# Patient Record
Sex: Female | Born: 1965 | Race: White | Hispanic: No | Marital: Married | Smoking: Never smoker
Health system: Southern US, Community
[De-identification: ages and names within clinical notes are randomized; demographics above are authoritative.]

## PROBLEM LIST (undated history)

## (undated) DIAGNOSIS — K219 Gastro-esophageal reflux disease without esophagitis: Secondary | ICD-10-CM

## (undated) DIAGNOSIS — Z9889 Other specified postprocedural states: Secondary | ICD-10-CM

## (undated) DIAGNOSIS — Z87442 Personal history of urinary calculi: Secondary | ICD-10-CM

## (undated) DIAGNOSIS — R112 Nausea with vomiting, unspecified: Secondary | ICD-10-CM

## (undated) DIAGNOSIS — N200 Calculus of kidney: Secondary | ICD-10-CM

## (undated) HISTORY — PX: COLONOSCOPY: SHX174

## (undated) HISTORY — DX: Calculus of kidney: N20.0

---

## 2005-06-26 ENCOUNTER — Ambulatory Visit: Payer: Self-pay | Admitting: Family Medicine

## 2006-08-05 ENCOUNTER — Ambulatory Visit: Payer: Self-pay | Admitting: Family Medicine

## 2007-08-11 ENCOUNTER — Ambulatory Visit: Payer: Self-pay | Admitting: Family Medicine

## 2008-01-27 ENCOUNTER — Ambulatory Visit: Payer: Self-pay | Admitting: Gastroenterology

## 2008-11-07 ENCOUNTER — Ambulatory Visit: Payer: Self-pay | Admitting: Family Medicine

## 2009-12-13 ENCOUNTER — Ambulatory Visit: Payer: Self-pay | Admitting: Family Medicine

## 2010-12-20 ENCOUNTER — Ambulatory Visit: Payer: Self-pay | Admitting: Family Medicine

## 2012-01-02 ENCOUNTER — Ambulatory Visit: Payer: Self-pay | Admitting: Family Medicine

## 2013-02-23 ENCOUNTER — Ambulatory Visit: Payer: Self-pay | Admitting: Family Medicine

## 2013-03-16 ENCOUNTER — Ambulatory Visit: Payer: Self-pay | Admitting: Family Medicine

## 2014-10-02 ENCOUNTER — Other Ambulatory Visit: Payer: Self-pay | Admitting: Otolaryngology

## 2014-10-02 DIAGNOSIS — H9042 Sensorineural hearing loss, unilateral, left ear, with unrestricted hearing on the contralateral side: Secondary | ICD-10-CM

## 2014-10-06 ENCOUNTER — Ambulatory Visit
Admission: RE | Admit: 2014-10-06 | Discharge: 2014-10-06 | Disposition: A | Discharge status: Home / Self Care | Payer: 59 | Source: Ambulatory Visit | Attending: Otolaryngology | Admitting: Otolaryngology

## 2014-10-06 DIAGNOSIS — H9042 Sensorineural hearing loss, unilateral, left ear, with unrestricted hearing on the contralateral side: Secondary | ICD-10-CM | POA: Insufficient documentation

## 2014-10-06 DIAGNOSIS — M5022 Other cervical disc displacement, mid-cervical region: Secondary | ICD-10-CM | POA: Insufficient documentation

## 2014-10-06 DIAGNOSIS — M47812 Spondylosis without myelopathy or radiculopathy, cervical region: Secondary | ICD-10-CM | POA: Diagnosis not present

## 2014-10-06 DIAGNOSIS — M5021 Other cervical disc displacement,  high cervical region: Secondary | ICD-10-CM | POA: Diagnosis not present

## 2014-10-06 MED ORDER — GADOBENATE DIMEGLUMINE 529 MG/ML IV SOLN
15.0000 mL | Freq: Once | INTRAVENOUS | Status: AC | PRN
  Administered 2014-10-06: 15 mL via INTRAVENOUS

## 2015-02-08 ENCOUNTER — Other Ambulatory Visit: Payer: Self-pay | Admitting: Family Medicine

## 2015-02-08 DIAGNOSIS — Z1231 Encounter for screening mammogram for malignant neoplasm of breast: Secondary | ICD-10-CM

## 2015-03-05 ENCOUNTER — Ambulatory Visit
Admission: RE | Admit: 2015-03-05 | Discharge: 2015-03-05 | Disposition: A | Discharge status: Home / Self Care | Payer: 59 | Source: Ambulatory Visit | Attending: Family Medicine | Admitting: Family Medicine

## 2015-03-05 DIAGNOSIS — Z1231 Encounter for screening mammogram for malignant neoplasm of breast: Secondary | ICD-10-CM | POA: Insufficient documentation

## 2016-11-18 ENCOUNTER — Other Ambulatory Visit: Payer: Self-pay | Admitting: Family Medicine

## 2016-11-18 DIAGNOSIS — Z1231 Encounter for screening mammogram for malignant neoplasm of breast: Secondary | ICD-10-CM

## 2016-12-12 ENCOUNTER — Ambulatory Visit
Admission: RE | Admit: 2016-12-12 | Discharge: 2016-12-12 | Disposition: A | Discharge status: Home / Self Care | Payer: 59 | Source: Ambulatory Visit | Attending: Family Medicine | Admitting: Family Medicine

## 2016-12-12 DIAGNOSIS — Z1231 Encounter for screening mammogram for malignant neoplasm of breast: Secondary | ICD-10-CM | POA: Insufficient documentation

## 2017-11-09 ENCOUNTER — Other Ambulatory Visit: Payer: Self-pay | Admitting: Family Medicine

## 2017-11-09 DIAGNOSIS — Z1231 Encounter for screening mammogram for malignant neoplasm of breast: Secondary | ICD-10-CM

## 2017-12-18 ENCOUNTER — Ambulatory Visit
Admission: RE | Admit: 2017-12-18 | Discharge: 2017-12-18 | Disposition: A | Discharge status: Home / Self Care | Payer: 59 | Source: Ambulatory Visit | Attending: Family Medicine | Admitting: Family Medicine

## 2017-12-18 DIAGNOSIS — Z1231 Encounter for screening mammogram for malignant neoplasm of breast: Secondary | ICD-10-CM | POA: Diagnosis not present

## 2018-10-26 ENCOUNTER — Other Ambulatory Visit: Payer: Self-pay | Admitting: Family Medicine

## 2018-10-26 DIAGNOSIS — Z1231 Encounter for screening mammogram for malignant neoplasm of breast: Secondary | ICD-10-CM

## 2018-12-23 ENCOUNTER — Encounter (INDEPENDENT_AMBULATORY_CARE_PROVIDER_SITE_OTHER): Payer: Self-pay

## 2018-12-23 ENCOUNTER — Other Ambulatory Visit: Payer: Self-pay

## 2018-12-23 ENCOUNTER — Ambulatory Visit
Admission: RE | Admit: 2018-12-23 | Discharge: 2018-12-23 | Disposition: A | Discharge status: Home / Self Care | Payer: Managed Care, Other (non HMO) | Source: Ambulatory Visit | Attending: Family Medicine | Admitting: Family Medicine

## 2018-12-23 DIAGNOSIS — Z1231 Encounter for screening mammogram for malignant neoplasm of breast: Secondary | ICD-10-CM | POA: Insufficient documentation

## 2019-11-16 ENCOUNTER — Other Ambulatory Visit: Payer: Self-pay | Admitting: Family Medicine

## 2019-11-16 DIAGNOSIS — Z1231 Encounter for screening mammogram for malignant neoplasm of breast: Secondary | ICD-10-CM

## 2019-12-29 ENCOUNTER — Other Ambulatory Visit: Payer: Self-pay

## 2019-12-29 ENCOUNTER — Ambulatory Visit
Admission: RE | Admit: 2019-12-29 | Discharge: 2019-12-29 | Disposition: A | Discharge status: Home / Self Care | Payer: Managed Care, Other (non HMO) | Source: Ambulatory Visit | Attending: Family Medicine | Admitting: Family Medicine

## 2019-12-29 DIAGNOSIS — Z1231 Encounter for screening mammogram for malignant neoplasm of breast: Secondary | ICD-10-CM

## 2020-04-20 ENCOUNTER — Observation Stay: Payer: Managed Care, Other (non HMO) | Admitting: Registered Nurse

## 2020-04-20 ENCOUNTER — Observation Stay: Payer: Managed Care, Other (non HMO)

## 2020-04-20 ENCOUNTER — Other Ambulatory Visit: Payer: Self-pay

## 2020-04-20 ENCOUNTER — Emergency Department: Payer: Managed Care, Other (non HMO)

## 2020-04-20 ENCOUNTER — Ambulatory Visit
Admission: EM | Admit: 2020-04-20 | Discharge: 2020-04-20 | Disposition: A | Discharge status: Home / Self Care | Payer: Managed Care, Other (non HMO) | Source: Home / Self Care

## 2020-04-20 ENCOUNTER — Inpatient Hospital Stay
Admission: EM | Admit: 2020-04-20 | Discharge: 2020-04-24 | DRG: 854 | Disposition: A | Discharge status: Home / Self Care | Payer: Managed Care, Other (non HMO) | Attending: Internal Medicine | Admitting: Internal Medicine

## 2020-04-20 ENCOUNTER — Encounter
Admission: EM | Disposition: A | Discharge status: Home / Self Care | Payer: Self-pay | Source: Home / Self Care | Attending: Internal Medicine

## 2020-04-20 ENCOUNTER — Ambulatory Visit (INDEPENDENT_AMBULATORY_CARE_PROVIDER_SITE_OTHER): Payer: Managed Care, Other (non HMO)

## 2020-04-20 DIAGNOSIS — N390 Urinary tract infection, site not specified: Secondary | ICD-10-CM

## 2020-04-20 DIAGNOSIS — D72829 Elevated white blood cell count, unspecified: Secondary | ICD-10-CM | POA: Diagnosis not present

## 2020-04-20 DIAGNOSIS — N200 Calculus of kidney: Secondary | ICD-10-CM

## 2020-04-20 DIAGNOSIS — Z20822 Contact with and (suspected) exposure to covid-19: Secondary | ICD-10-CM | POA: Diagnosis present

## 2020-04-20 DIAGNOSIS — Z8679 Personal history of other diseases of the circulatory system: Secondary | ICD-10-CM

## 2020-04-20 DIAGNOSIS — A419 Sepsis, unspecified organism: Secondary | ICD-10-CM | POA: Diagnosis present

## 2020-04-20 DIAGNOSIS — Z87442 Personal history of urinary calculi: Secondary | ICD-10-CM

## 2020-04-20 DIAGNOSIS — A4150 Gram-negative sepsis, unspecified: Secondary | ICD-10-CM | POA: Diagnosis not present

## 2020-04-20 DIAGNOSIS — N136 Pyonephrosis: Secondary | ICD-10-CM | POA: Diagnosis present

## 2020-04-20 DIAGNOSIS — N132 Hydronephrosis with renal and ureteral calculous obstruction: Secondary | ICD-10-CM | POA: Diagnosis not present

## 2020-04-20 DIAGNOSIS — N12 Tubulo-interstitial nephritis, not specified as acute or chronic: Secondary | ICD-10-CM

## 2020-04-20 DIAGNOSIS — Z79899 Other long term (current) drug therapy: Secondary | ICD-10-CM

## 2020-04-20 DIAGNOSIS — I10 Essential (primary) hypertension: Secondary | ICD-10-CM | POA: Diagnosis present

## 2020-04-20 DIAGNOSIS — E876 Hypokalemia: Secondary | ICD-10-CM | POA: Diagnosis present

## 2020-04-20 DIAGNOSIS — R109 Unspecified abdominal pain: Secondary | ICD-10-CM | POA: Diagnosis not present

## 2020-04-20 DIAGNOSIS — R1032 Left lower quadrant pain: Secondary | ICD-10-CM | POA: Diagnosis not present

## 2020-04-20 DIAGNOSIS — N202 Calculus of kidney with calculus of ureter: Secondary | ICD-10-CM

## 2020-04-20 HISTORY — PX: CYSTOSCOPY WITH STENT PLACEMENT: SHX5790

## 2020-04-20 LAB — CBC WITH DIFFERENTIAL/PLATELET
Abs Immature Granulocytes: 0.42 10*3/uL — ABNORMAL HIGH (ref 0.00–0.07)
Basophils Absolute: 0.2 10*3/uL — ABNORMAL HIGH (ref 0.0–0.1)
Basophils Relative: 0 %
Eosinophils Absolute: 0.1 10*3/uL (ref 0.0–0.5)
Eosinophils Relative: 0 %
HCT: 40.6 % (ref 36.0–46.0)
Hemoglobin: 13.5 g/dL (ref 12.0–15.0)
Immature Granulocytes: 1 %
Lymphocytes Relative: 4 %
Lymphs Abs: 1.4 10*3/uL (ref 0.7–4.0)
MCH: 31.1 pg (ref 26.0–34.0)
MCHC: 33.3 g/dL (ref 30.0–36.0)
MCV: 93.5 fL (ref 80.0–100.0)
Monocytes Absolute: 0.3 10*3/uL (ref 0.1–1.0)
Monocytes Relative: 1 %
Neutro Abs: 33.5 10*3/uL — ABNORMAL HIGH (ref 1.7–7.7)
Neutrophils Relative %: 94 %
Platelets: 565 10*3/uL — ABNORMAL HIGH (ref 150–400)
RBC: 4.34 MIL/uL (ref 3.87–5.11)
RDW: 12.4 % (ref 11.5–15.5)
Smear Review: NORMAL
WBC Morphology: INCREASED
WBC: 35.9 10*3/uL — ABNORMAL HIGH (ref 4.0–10.5)
nRBC: 0 % (ref 0.0–0.2)

## 2020-04-20 LAB — URINALYSIS, COMPLETE (UACMP) WITH MICROSCOPIC
Bilirubin Urine: NEGATIVE
Glucose, UA: NEGATIVE mg/dL
Ketones, ur: NEGATIVE mg/dL
Nitrite: POSITIVE — AB
Protein, ur: NEGATIVE mg/dL
Specific Gravity, Urine: 1.02 (ref 1.005–1.030)
pH: 5.5 (ref 5.0–8.0)

## 2020-04-20 LAB — COMPREHENSIVE METABOLIC PANEL
ALT: 31 U/L (ref 0–44)
AST: 28 U/L (ref 15–41)
Albumin: 4 g/dL (ref 3.5–5.0)
Alkaline Phosphatase: 87 U/L (ref 38–126)
Anion gap: 12 (ref 5–15)
BUN: 19 mg/dL (ref 6–20)
CO2: 23 mmol/L (ref 22–32)
Calcium: 9.6 mg/dL (ref 8.9–10.3)
Chloride: 102 mmol/L (ref 98–111)
Creatinine, Ser: 0.87 mg/dL (ref 0.44–1.00)
GFR, Estimated: >60 mL/min (ref >60)
Glucose, Bld: 112 mg/dL — ABNORMAL HIGH (ref 70–99)
Potassium: 3.2 mmol/L — ABNORMAL LOW (ref 3.5–5.1)
Sodium: 137 mmol/L (ref 135–145)
Total Bilirubin: 0.6 mg/dL (ref 0.3–1.2)
Total Protein: 8 g/dL (ref 6.5–8.1)

## 2020-04-20 LAB — RESP PANEL BY RT-PCR (FLU A&B, COVID) ARPGX2
Influenza A by PCR: NEGATIVE
Influenza B by PCR: NEGATIVE
SARS Coronavirus 2 by RT PCR: NEGATIVE

## 2020-04-20 LAB — HIV ANTIBODY (ROUTINE TESTING W REFLEX): HIV Screen 4th Generation wRfx: NONREACTIVE

## 2020-04-20 SURGERY — CYSTOSCOPY, WITH STENT INSERTION
Anesthesia: General | Laterality: Left

## 2020-04-20 MED ORDER — IOPAMIDOL (ISOVUE-300) INJECTION 61%
INTRAVENOUS | Status: DC | PRN
  Administered 2020-04-20: 20 mL via URETHRAL

## 2020-04-20 MED ORDER — SODIUM CHLORIDE 0.9 % IR SOLN
Status: DC | PRN
  Administered 2020-04-20: 3000 mL via INTRAVESICAL

## 2020-04-20 MED ORDER — ONDANSETRON HCL 4 MG PO TABS
4.0000 mg | ORAL_TABLET | Freq: Four times a day (QID) | ORAL | Status: DC | PRN
  Administered 2020-04-22: 4 mg via ORAL
  Filled 2020-04-20: qty 1

## 2020-04-20 MED ORDER — ACETAMINOPHEN 500 MG PO TABS
1000.0000 mg | ORAL_TABLET | Freq: Four times a day (QID) | ORAL | Status: AC | PRN
  Administered 2020-04-21 – 2020-04-23 (×9): 1000 mg via ORAL
  Filled 2020-04-20 (×10): qty 2

## 2020-04-20 MED ORDER — FENTANYL CITRATE (PF) 100 MCG/2ML IJ SOLN
INTRAMUSCULAR | Status: AC
  Filled 2020-04-20: qty 2

## 2020-04-20 MED ORDER — STERILE WATER FOR IRRIGATION IR SOLN
Status: DC | PRN
  Administered 2020-04-20: 1000 mL

## 2020-04-20 MED ORDER — SODIUM CHLORIDE 0.9 % IV BOLUS
1000.0000 mL | Freq: Once | INTRAVENOUS | Status: AC
  Administered 2020-04-20: 1000 mL via INTRAVENOUS

## 2020-04-20 MED ORDER — MIDAZOLAM HCL 2 MG/2ML IJ SOLN
INTRAMUSCULAR | Status: DC | PRN
  Administered 2020-04-20: 2 mg via INTRAVENOUS

## 2020-04-20 MED ORDER — SCOPOLAMINE 1 MG/3DAYS TD PT72
1.0000 | MEDICATED_PATCH | Freq: Once | TRANSDERMAL | Status: DC
  Administered 2020-04-20: 1.5 mg via TRANSDERMAL

## 2020-04-20 MED ORDER — FENTANYL CITRATE (PF) 100 MCG/2ML IJ SOLN
INTRAMUSCULAR | Status: DC | PRN
  Administered 2020-04-20 (×2): 50 ug via INTRAVENOUS

## 2020-04-20 MED ORDER — MIDAZOLAM HCL 2 MG/2ML IJ SOLN
INTRAMUSCULAR | Status: AC
  Filled 2020-04-20: qty 2

## 2020-04-20 MED ORDER — ONDANSETRON HCL 4 MG/2ML IJ SOLN
4.0000 mg | Freq: Four times a day (QID) | INTRAMUSCULAR | Status: DC | PRN
  Administered 2020-04-21: 4 mg via INTRAVENOUS
  Filled 2020-04-20: qty 2

## 2020-04-20 MED ORDER — MORPHINE SULFATE (PF) 4 MG/ML IV SOLN
4.0000 mg | Freq: Once | INTRAVENOUS | Status: AC
  Administered 2020-04-20: 4 mg via INTRAVENOUS
  Filled 2020-04-20: qty 1

## 2020-04-20 MED ORDER — LACTATED RINGERS IV SOLN: INTRAVENOUS | Status: AC

## 2020-04-20 MED ORDER — PROPOFOL 10 MG/ML IV BOLUS
INTRAVENOUS | Status: DC | PRN
  Administered 2020-04-20: 160 mg via INTRAVENOUS

## 2020-04-20 MED ORDER — FENTANYL CITRATE (PF) 100 MCG/2ML IJ SOLN
25.0000 ug | INTRAMUSCULAR | Status: DC | PRN
Start: 2020-04-20 — End: 2020-04-20

## 2020-04-20 MED ORDER — PROPOFOL 10 MG/ML IV BOLUS
INTRAVENOUS | Status: AC
  Filled 2020-04-20: qty 20

## 2020-04-20 MED ORDER — ONDANSETRON HCL 4 MG/2ML IJ SOLN
INTRAMUSCULAR | Status: AC
  Filled 2020-04-20: qty 2

## 2020-04-20 MED ORDER — SODIUM CHLORIDE 0.9 % IV SOLN
1.0000 g | Freq: Once | INTRAVENOUS | Status: AC
  Administered 2020-04-20: 1 g via INTRAVENOUS
  Filled 2020-04-20: qty 10

## 2020-04-20 MED ORDER — KETOROLAC TROMETHAMINE 30 MG/ML IJ SOLN
INTRAMUSCULAR | Status: DC | PRN
  Administered 2020-04-20: 30 mg via INTRAVENOUS

## 2020-04-20 MED ORDER — MORPHINE SULFATE (PF) 4 MG/ML IV SOLN
4.0000 mg | INTRAVENOUS | Status: AC | PRN
  Administered 2020-04-20 – 2020-04-22 (×4): 4 mg via INTRAVENOUS
  Filled 2020-04-20 (×4): qty 1

## 2020-04-20 MED ORDER — SODIUM CHLORIDE 0.9 % IV SOLN
1.0000 g | INTRAVENOUS | Status: DC
  Administered 2020-04-21: 1 g via INTRAVENOUS
  Filled 2020-04-20: qty 10
  Filled 2020-04-20: qty 1

## 2020-04-20 MED ORDER — ONDANSETRON HCL 4 MG/2ML IJ SOLN
4.0000 mg | Freq: Once | INTRAMUSCULAR | Status: AC | PRN
  Administered 2020-04-20: 4 mg via INTRAVENOUS

## 2020-04-20 MED ORDER — ACETAMINOPHEN 650 MG RE SUPP: 650.0000 mg | Freq: Four times a day (QID) | RECTAL | Status: AC | PRN

## 2020-04-20 MED ORDER — KETOROLAC TROMETHAMINE 15 MG/ML IJ SOLN
15.0000 mg | Freq: Four times a day (QID) | INTRAMUSCULAR | Status: AC | PRN
  Administered 2020-04-21: 15 mg via INTRAVENOUS
  Filled 2020-04-20: qty 1

## 2020-04-20 MED ORDER — POTASSIUM CHLORIDE 10 MEQ/100ML IV SOLN
10.0000 meq | INTRAVENOUS | Status: AC
  Administered 2020-04-20: 10 meq via INTRAVENOUS
  Filled 2020-04-20 (×2): qty 100

## 2020-04-20 MED ORDER — ONDANSETRON HCL 4 MG/2ML IJ SOLN
4.0000 mg | Freq: Once | INTRAMUSCULAR | Status: AC
  Administered 2020-04-20: 4 mg via INTRAVENOUS
  Filled 2020-04-20: qty 2

## 2020-04-20 SURGICAL SUPPLY — 17 items
BAG DRAIN CYSTO-URO LG1000N (MISCELLANEOUS) ×3 IMPLANT
BRUSH SCRUB EZ  4% CHG (MISCELLANEOUS) ×2
BRUSH SCRUB EZ 4% CHG (MISCELLANEOUS) ×1 IMPLANT
CATH URETL 5X70 OPEN END (CATHETERS) ×3 IMPLANT
CONRAY 43 FOR UROLOGY 50M (MISCELLANEOUS) ×3 IMPLANT
DRAPE UTILITY 15X26 TOWEL STRL (DRAPES) ×3 IMPLANT
GOWN STRL REUS W/ TWL LRG LVL3 (GOWN DISPOSABLE) ×1 IMPLANT
GOWN STRL REUS W/TWL LRG LVL3 (GOWN DISPOSABLE) ×2
GUIDEWIRE STR DUAL SENSOR (WIRE) ×3 IMPLANT
IV NS IRRIG 3000ML ARTHROMATIC (IV SOLUTION) ×3 IMPLANT
KIT TURNOVER CYSTO (KITS) ×3 IMPLANT
MANIFOLD NEPTUNE II (INSTRUMENTS) ×3 IMPLANT
PACK CYSTO AR (MISCELLANEOUS) ×3 IMPLANT
SET CYSTO W/LG BORE CLAMP LF (SET/KITS/TRAYS/PACK) ×3 IMPLANT
STENT URET 6FRX24 CONTOUR (STENTS) ×3 IMPLANT
SURGILUBE 2OZ TUBE FLIPTOP (MISCELLANEOUS) ×3 IMPLANT
WATER STERILE IRR 1000ML POUR (IV SOLUTION) ×3 IMPLANT

## 2020-04-20 NOTE — Op Note (Signed)
Operative Note  Preoperative diagnosis:  1.  Left ureteral calculus with large staghorn calculus and hydronephrosis with UTI  Post operative diagnosis: 1.  Same  Procedure(s): 1.  Cystoscopy with left retrograde pyelogram and left ureteral stent placement  Surgeon: Modena Slater, MD  Assistants: None  Anesthesia: General  Complications: None immediate  EBL: Minimal  Specimens: 1.  None  Drains/Catheters: 1.  6 X 24 double-J ureteral stent  Intraoperative findings: 1.  Normal urethra and bladder 2.  Left retrograde pyelogram revealed a filling defect at the level of the stone with upstream hydroureteronephrosis.  Large radiopaque staghorn calculus on the left.  Stent was positioned in the upper pole  Indication: 55 year old female presented with left-sided flank pain and obstructing left proximal ureteral stone as well as a staghorn calculus on the left and leukocytosis of 36.  Due to concern of early sepsis, she was taken urgently to the operating room for ureteral stent.  Description of procedure:  The patient was identified and consent was obtained.  The patient was taken to the operating room and placed in the supine position.  The patient was placed under general anesthesia.  Perioperative antibiotics were administered.  The patient was placed in dorsal lithotomy.  Patient was prepped and draped in a standard sterile fashion and a timeout was performed.  A 21 French rigid cystoscope was advanced into the urethra and into the bladder.  The left distal most portion of the ureter was cannulated with an open-ended ureteral catheter.  Retrograde pyelogram was performed with the findings noted above.  A sensor wire was then advanced up to the kidney under fluoroscopic guidance.  A 6 X 24 double-J ureteral stent was advanced up to the kidney under fluoroscopic guidance.  The wire was withdrawn and fluoroscopy confirmed good proximal placement and direct visualization confirmed a good  coil within the bladder.  The bladder was drained and the scope withdrawn.  This concluded the operation.  Patient tolerated procedure well and was stable postoperatively.  Plan: Continue IV antibiotics until cultures return.  She will eventually need to undergo left PCNL.

## 2020-04-20 NOTE — Anesthesia Procedure Notes (Signed)
Procedure Name: LMA Insertion Performed by: Eduardo Osier, CRNA Pre-anesthesia Checklist: Patient identified, Emergency Drugs available, Suction available, Patient being monitored and Timeout performed Patient Re-evaluated:Patient Re-evaluated prior to induction Oxygen Delivery Method: Circle system utilized Preoxygenation: Pre-oxygenation with 100% oxygen Induction Type: IV induction LMA: LMA inserted LMA Size: 4.0 Number of attempts: 1

## 2020-04-20 NOTE — Consult Note (Signed)
H&P Physician requesting consult: Amy Cox  Chief Complaint: Left staghorn calculus, left ureteral stone, UTI concern for sepsis  History of Present Illness: 55 year old female presented with acute left-sided flank pain.  She has a history of nephrolithiasis.  She has always passed her stones in the past.  She has never required any intervention for stones.  Never seen a urologist.  However, she presented today with left-sided flank pain, leukocytosis of 36, creatinine 0.87.  Urinalysis was consistent with infection.  She subsequent underwent a CT renal stone protocol.  This revealed an 11 mm obstructing left ureteropelvic junction calculus with moderate dilation of the collecting system with a tiny gas bubble in the interpolar calyx in the low renal pelvis.  She also had a large staghorn calculus in the left renal pelvis.  She continues to have significant pain and discomfort.  She has tachycardia but blood pressure is stable.  She has been afebrile.  History reviewed. No pertinent past medical history. History reviewed. No pertinent surgical history.  Home Medications:  Medications Prior to Admission  Medication Sig Dispense Refill Last Dose  . gemfibrozil (LOPID) 600 MG tablet Take 600 mg by mouth 2 (two) times daily.   04/20/2020 at am  . hydrochlorothiazide (MICROZIDE) 12.5 MG capsule Take 12.5 mg by mouth daily.   04/20/2020 at Unknown time  . niacin (NIASPAN) 500 MG CR tablet Take 500 mg by mouth daily.   04/20/2020 at Unknown time  . NORTREL 7/7/7 0.5/0.75/1-35 MG-MCG tablet Take by mouth.   04/20/2020 at Unknown time  . omeprazole (PRILOSEC) 20 MG capsule Take 1 capsule by mouth daily.   04/20/2020 at Unknown time  . promethazine (PHENERGAN) 25 MG tablet Take 25 mg by mouth every 6 (six) hours as needed.   04/20/2020 at Unknown time  . SUMAtriptan (IMITREX) 100 MG tablet Take by mouth.   unknown at unknown  . amoxicillin-clavulanate (AUGMENTIN) 875-125 MG tablet Take 1 tablet by mouth every 12  (twelve) hours. (Patient not taking: Reported on 04/20/2020)   Completed Course at Unknown time   Allergies: No Known Allergies  Family History  Problem Relation Age of Onset  . Breast cancer Neg Hx    Social History:  reports that she has never smoked. She has never used smokeless tobacco. She reports previous alcohol use. She reports that she does not use drugs.  ROS: A complete review of systems was performed.  All systems are negative except for pertinent findings as noted. ROS   Physical Exam:  Vital signs in last 24 hours: Temp:  [97.7 F (36.5 C)-98.9 F (37.2 C)] 98.4 F (36.9 C) (04/08 1643) Pulse Rate:  [88-113] 113 (04/08 1643) Resp:  [13-20] 18 (04/08 1643) BP: (113-153)/(51-101) 127/51 (04/08 1643) SpO2:  [95 %-99 %] 95 % (04/08 1643) Weight:  [72.6 kg] 72.6 kg (04/08 1241) General:  Alert and oriented, No acute distress HEENT: Normocephalic, atraumatic Neck: No JVD or lymphadenopathy Cardiovascular: Normal rhythm but tachycardic Lungs: Regular rate and effort Abdomen: Soft, nontender, nondistended, no abdominal masses Back: No CVA tenderness Extremities: No edema Neurologic: Grossly intact  Laboratory Data:  Results for orders placed or performed during the hospital encounter of 04/20/20 (from the past 24 hour(s))  Resp Panel by RT-PCR (Flu A&B, Covid) Nasopharyngeal Swab     Status: None   Collection Time: 04/20/20 12:36 PM   Specimen: Nasopharyngeal Swab; Nasopharyngeal(NP) swabs in vial transport medium  Result Value Ref Range   SARS Coronavirus 2 by RT PCR NEGATIVE NEGATIVE  Influenza A by PCR NEGATIVE NEGATIVE   Influenza B by PCR NEGATIVE NEGATIVE   Recent Results (from the past 240 hour(s))  Resp Panel by RT-PCR (Flu A&B, Covid) Nasopharyngeal Swab     Status: None   Collection Time: 04/20/20 12:36 PM   Specimen: Nasopharyngeal Swab; Nasopharyngeal(NP) swabs in vial transport medium  Result Value Ref Range Status   SARS Coronavirus 2 by RT PCR  NEGATIVE NEGATIVE Final    Comment: (NOTE) SARS-CoV-2 target nucleic acids are NOT DETECTED.  The SARS-CoV-2 RNA is generally detectable in upper respiratory specimens during the acute phase of infection. The lowest concentration of SARS-CoV-2 viral copies this assay can detect is 138 copies/mL. A negative result does not preclude SARS-Cov-2 infection and should not be used as the sole basis for treatment or other patient management decisions. A negative result may occur with  improper specimen collection/handling, submission of specimen other than nasopharyngeal swab, presence of viral mutation(s) within the areas targeted by this assay, and inadequate number of viral copies(<138 copies/mL). A negative result must be combined with clinical observations, patient history, and epidemiological information. The expected result is Negative.  Fact Sheet for Patients:  BloggerCourse.com  Fact Sheet for Healthcare Providers:  SeriousBroker.it  This test is no t yet approved or cleared by the [place] FDA and  has been authorized for detection and/or diagnosis of SARS-CoV-2 by FDA under an Emergency Use Authorization (EUA). This EUA will remain  in effect (meaning this test can be used) for the duration of the COVID-19 declaration under Section 564(b)(1) of the Act, 21 U.S.C.section 360bbb-3(b)(1), unless the authorization is terminated  or revoked sooner.       Influenza A by PCR NEGATIVE NEGATIVE Final   Influenza B by PCR NEGATIVE NEGATIVE Final    Comment: (NOTE) The Xpert Xpress SARS-CoV-2/FLU/RSV plus assay is intended as an aid in the diagnosis of influenza from Nasopharyngeal swab specimens and should not be used as a sole basis for treatment. Nasal washings and aspirates are unacceptable for Xpert Xpress SARS-CoV-2/FLU/RSV testing.  Fact Sheet for Patients: BloggerCourse.com  Fact Sheet for  Healthcare Providers: SeriousBroker.it  This test is not yet approved or cleared by the [place] FDA and has been authorized for detection and/or diagnosis of SARS-CoV-2 by FDA under an Emergency Use Authorization (EUA). This EUA will remain in effect (meaning this test can be used) for the duration of the COVID-19 declaration under Section 564(b)(1) of the Act, 21 U.S.C. section 360bbb-3(b)(1), unless the authorization is terminated or revoked.  Performed at [facility], [address] [place], [place] [address]    Creatinine: Recent Labs    04/20/20 1116  CREATININE 0.87   CT scan personally reviewed and is detailed in the history of present illness  Impression/Assessment:  Left renal and ureteral calculus Ureteral obstruction secondary to calculus Possible early sepsis secondary to urinary tract infection  Plan:  Plan for urgent left ureteral stent placement.  I will attempt to get the curl in the left upper pole.  She understands she may need a nephrostomy tube if ureteral stenting fails.  Continue ceftriaxone until cultures return.  She has a urine culture ordered but I do not see blood cultures ordered so I ordered that.  Ray Church, III 04/20/2020, 6:18 PM

## 2020-04-20 NOTE — Anesthesia Postprocedure Evaluation (Signed)
Anesthesia Post Note  Patient: Laura Adams  Procedure(s) Performed: CYSTOSCOPY WITH STENT PLACEMENT (Left )  Patient location during evaluation: PACU Anesthesia Type: General Level of consciousness: awake and alert Pain management: pain level controlled Vital Signs Assessment: post-procedure vital signs reviewed and stable Respiratory status: spontaneous breathing and respiratory function stable Cardiovascular status: stable Anesthetic complications: no   No complications documented.   Last Vitals:  Vitals:   04/20/20 2030 04/20/20 2045  BP: (!) 146/80 (!) 148/79  Pulse: (!) 116 (!) 112  Resp: 16 18  Temp:  37.2 C  SpO2: 93% 94%    Last Pain:  Vitals:   04/20/20 2045  TempSrc:   PainSc: 0-No pain                 Allysha Tryon K

## 2020-04-20 NOTE — H&P (Signed)
History and Physical   Laura Adams ASN:[id number] DOB: September 04, 1965 DOA: 04/20/2020  PCP: Lynnell Jude, MD  Patient coming from: home  I have personally briefly reviewed patient's old medical records in [place].  Chief Concern: back pain  HPI: Laura Adams is a 55 y.o. female with medical history significant for multiple nephrolithiasis, left-sided staghorn calculi, presents emergency department for chief concerns of back pain.  She endorses the left sided back pain, along the thoracic levels, started approximately 9 AM, was 10/10, sharp pain that radiated to her left lower abdominal. She endorses chills, nausea, vomiting, multiple loose stools. She reports the stool was brown and loose. They were not watery.  She reports the pain is similar to her previous episode of kidney stones.  She denies fever, chest pain, shortness of breath, headaches, vision changes, dysphagia.  She last drank two sips of water at approximately 12-12:30 PM on day presentation.  Social history: lives with spouse. She has never had children. She denies tobacco use, recreational drugs.  She reports she will infrequently have a drink.  Vaccination: Patient states she is vaccinated for COVID-19 with 3 doses.  ROS: Constitutional: no weight change, no fever, + chills ENT/Mouth: no sore throat, no rhinorrhea Eyes: no eye pain, no vision changes Cardiovascular: no chest pain, no dyspnea,  no edema, no palpitations Respiratory: no cough, no sputum, no wheezing Gastrointestinal: + nausea, no vomiting, no diarrhea, no constipation Genitourinary: no urinary incontinence, no dysuria, no hematuria Musculoskeletal: no arthralgias, no myalgias, thoracic level back pain Skin: no skin lesions, no pruritus, Neuro: + weakness, no loss of consciousness, no syncope Psych: no anxiety, no depression, + decrease appetite Heme/Lymph: no bruising, no bleeding  ED Course: Discussed with ED provider, patient requiring  hospitalization due to obstructing stone in the left UPJ.  Vitals in the emergency department show temperature 97.7, respiration rate of 14, heart rate 109, blood pressure 134/77, patient saturating at 98% SPO2 on room air.  Labs in the emergency department was remarkable for sodium 137, chloride 102, potassium 3.2, bicarb 23, BUN 19, serum creatinine of 0.87, nonfasting blood glucose of 112, EGFR greater than 60, WBC 35.9, hemoglobin 13.5, platelets 565.  UA revealed moderate leukocytes and positive for nitrites.  CT renal revealed staghorn calculi approximately 3.3 x 3 x 3.6 cm in the left renal pelvis with mild to moderate left hydronephrosis and perinephritic and peripelvic edema and inflammation.  Tiny gas bubbles seen in the interpolar calyx and in the renal pelvis near the UPJ.  Obstructing stone at the UPJ measuring 7 x 6 x 11 mm.  Additional stones are scattered in the left kidney.  Left ureter unremarkable.  Urinary bladder appears normal for the degree of distention.  No right ureteral stone.  1 mm nonobstructing stone noted in the upper pole right kidney.  ED provider gave patient 1 dose of ceftriaxone 1 g IV, normal saline 1 L bolus, morphine 4 mg IV for pain, ondansetron 4 mg IV the.  ED provider called Dr. Gloriann Loan who is aware of the patient and request that we keep patient n.p.o.  Assessment/Plan  Active Problems:   Hydronephrosis with obstructing calculus   Nephrolithiasis   History of hypertension   Leukocytosis   Sepsis ([employer])   Obstructing stone in the left UPJ-urology, Dr. Gloriann Loan has been consulted Meet sepsis criteria, respiration rate, heart rate, WBC, source is urine -No hypotension at this time, status post normal saline 1 L bolus per ED provider -  Keep patient n.p.o. -Status post ceftriaxone 1 g IV -Maintenance fluid with LR 125 mL/h, for 1 day ordered  Leukocytosis-secondary to UTI, treat as above  History of hypertension-Per chart review, patient has a history of  hypertension and was previously prescribed hydrochlorothiazide -However at bedside she states that she is not currently prescribed any medication and is not taking any over-the-counter medication other than a multivitamin and vitamin D -Pharmacy tech for med rec review  DVT prophylaxis-TED hose only, pharmacologic DVT prophylaxis not started, primary hospitalist team to initiate as appropriate  Chart reviewed.   DVT prophylaxis: TED hose, pharmacologic DVT prophylaxis has not been started in anticipation of urological surgical intervention Code Status: Full code Diet: N.p.o. Family Communication: Updated spouse at bedside Disposition Plan: Pending clinical course Consults called: Urology Admission status: Observation, MedSurg, with telemetry, 24 hours ordered  History reviewed. No pertinent past medical history.  History reviewed. No pertinent surgical history.  Social History:  reports that she has never smoked. She has never used smokeless tobacco. She reports previous alcohol use. She reports that she does not use drugs.  No Known Allergies Family History  Problem Relation Age of Onset  . Breast cancer Neg Hx    Family history: Family history reviewed and not pertinent  Prior to Admission medications   Medication Sig Start Date End Date Taking? Authorizing Provider  gemfibrozil (LOPID) 600 MG tablet Take 600 mg by mouth 2 (two) times daily. 12/26/19   [provider]  hydrochlorothiazide (MICROZIDE) 12.5 MG capsule Take 12.5 mg by mouth daily. 12/26/19   [provider]  niacin (NIASPAN) 500 MG CR tablet Take 500 mg by mouth daily. 12/26/19   [provider]  NORTREL 7/7/7 0.5/0.75/1-35 MG-MCG tablet Take by mouth. 12/26/19   [provider]  omeprazole (PRILOSEC) 20 MG capsule Take 1 capsule by mouth daily. 12/26/19   [provider]  promethazine (PHENERGAN) 25 MG tablet Take 25 mg by mouth every 6 (six) hours as needed. 04/10/20    [provider]  SUMAtriptan (IMITREX) 100 MG tablet Take by mouth. 04/09/20   [provider]   Physical Exam: Vitals:   04/20/20 1330 04/20/20 1400 04/20/20 1430 04/20/20 1511  BP: 134/68 139/67 134/77   Pulse: 93 (!) 105 (!) 105   Resp: _0 Temp:    98.3 F (36.8 C)  TempSrc:    Oral  SpO2: 98% 97% 96%   Weight:      Height:       Constitutional: appears age-appropriate, NAD, calm, comfortable Eyes: PERRL, lids and conjunctivae normal ENMT: Mucous membranes are moist. Posterior pharynx clear of any exudate or lesions. Age-appropriate dentition. Hearing appropriate Neck: normal, supple, no masses, no thyromegaly Respiratory: clear to auscultation bilaterally, no wheezing, no crackles. Normal respiratory effort. No accessory muscle use.  Cardiovascular: Regular rate and rhythm, no murmurs / rubs / gallops. No extremity edema. 2+ pedal pulses. No carotid bruits.  Abdomen: no tenderness, no masses palpated, no hepatosplenomegaly. Bowel sounds positive.  Musculoskeletal: no clubbing / cyanosis. No joint deformity upper and lower extremities. Good ROM, no contractures, no atrophy. Normal muscle tone.  Skin: no rashes, lesions, ulcers. No induration Neurologic: Sensation intact. Strength 5/5 in all 4.  Psychiatric: Normal judgment and insight. Alert and oriented x 3. Normal mood.   EKG: Not indicated  x-ray on Admission: I personally reviewed and I agree with radiologist reading as below.  DG Abdomen 1 View  Result Date: 04/20/2020 CLINICAL  DATA:  Left lower quadrant abdominal pain. EXAM: ABDOMEN - 1 VIEW COMPARISON:  None. FINDINGS: The bowel gas pattern is normal. Large staghorn type left renal calculus is noted. IMPRESSION: No evidence of bowel obstruction or ileus. Large left renal calculus is noted. Electronically Signed   By: Marijo Conception M.D.   On: 04/20/2020 11:29   CT Renal Stone Study  Result Date: 04/20/2020 CLINICAL DATA:  Low back pain radiates  abdomen. Nausea vomiting. History of kidney stones. EXAM: CT ABDOMEN AND PELVIS WITHOUT CONTRAST TECHNIQUE: Multidetector CT imaging of the abdomen and pelvis was performed following the standard protocol without IV contrast. COMPARISON:  None. FINDINGS: Lower chest: Unremarkable. Hepatobiliary: No focal abnormality in the liver on this study without intravenous contrast. There is no evidence for gallstones, gallbladder wall thickening, or pericholecystic fluid. No intrahepatic or extrahepatic biliary dilation. Pancreas: No focal mass lesion. No dilatation of the main duct. No intraparenchymal cyst. No peripancreatic edema. Spleen: No splenomegaly. No focal mass lesion. Adrenals/Urinary Tract: No adrenal nodule or mass. 1 mm nonobstructing stone noted upper pole right kidney (best seen on coronal image 95/series 5). No right ureteral stone. No secondary changes in the right kidney or ureter. Large staghorn calculus (approximately 3.3 x 3.0 x 3.6 cm) identified left renal pelvis with mild to moderate left hydronephrosis and perinephric and peripelvic edema/inflammation. Tiny gas bubbles are seen in an interpolar calyx and in the renal pelvis near the UPJ. Additional stones are seen scattered in the left kidney with an obstructing stone at the UPJ measuring 7 x 6 x 11 mm. Left ureter unremarkable. The urinary bladder appears normal for the degree of distention. Stomach/Bowel: Stomach is unremarkable. No gastric wall thickening. No evidence of outlet obstruction. Duodenum is normally positioned as is the ligament of Treitz. No small bowel wall thickening. No small bowel dilatation. The terminal ileum is normal. The appendix is normal. No gross colonic mass. No colonic wall thickening. Vascular/Lymphatic: No abdominal aortic aneurysm. There is no gastrohepatic or hepatoduodenal ligament lymphadenopathy. Borderline lymphadenopathy noted left para-aortic space with 11 mm short axis node visible on image 33/2. No pelvic  sidewall lymphadenopathy. Reproductive: The uterus is unremarkable.  There is no adnexal mass. Other: No intraperitoneal free fluid. Musculoskeletal: Advanced degenerative changes noted right hip No worrisome lytic or sclerotic osseous abnormality. IMPRESSION: 1. 7 x 6 x 11 mm obstructing stone at the left UPJ. Moderate dilatation of the left intrarenal collecting system with tiny gas bubble seen in an interpolar calyx and in the low renal pelvis, features compatible with pyonephrosis. 2. Very large staghorn calculus identified in the right renal pelvis extending into interpolar calices. Additional scattered stones noted in the left kidney. 3. Advanced degenerative changes right hip. 4. Emphysema (ICD10-J43.9). Electronically Signed   By: Misty Stanley M.D.   On: 04/20/2020 13:51   Labs on Admission: I have personally reviewed following labs  CBC: Recent Labs  Lab 04/20/20 1116  WBC 35.9*  NEUTROABS 33.5*  HGB 13.5  HCT 40.6  MCV 93.5  PLT 093*   Basic Metabolic Panel: Recent Labs  Lab 04/20/20 1116  NA 137  K 3.2*  CL 102  CO2 23  GLUCOSE 112*  BUN 19  CREATININE 0.87  CALCIUM 9.6   GFR: Estimated Creatinine Clearance: 71.1 mL/min (by C-G formula based on SCr of 0.87 mg/dL).  Liver Function Tests: Recent Labs  Lab 04/20/20 1116  AST 28  ALT 31  ALKPHOS 87  BILITOT 0.6  PROT 8.0  ALBUMIN 4.0   Urine analysis:    Component Value Date/Time   COLORURINE YELLOW 04/20/2020 1116   APPEARANCEUR HAZY (A) 04/20/2020 1116   LABSPEC 1.020 04/20/2020 1116   PHURINE 5.5 04/20/2020 1116   GLUCOSEU NEGATIVE 04/20/2020 1116   HGBUR MODERATE (A) 04/20/2020 1116   BILIRUBINUR NEGATIVE 04/20/2020 1116   KETONESUR NEGATIVE 04/20/2020 1116   PROTEINUR NEGATIVE 04/20/2020 1116   NITRITE POSITIVE (A) 04/20/2020 1116   LEUKOCYTESUR MODERATE (A) 04/20/2020 1116   Cythina Mickelsen N Beadie Matsunaga D.O. [facility]  If 7PM-7AM, please contact overnight-coverage provider If 7AM-7PM, please contact  day coverage provider [email or web address]  04/20/2020, 3:47 PM

## 2020-04-20 NOTE — Discharge Instructions (Addendum)
As we discussed you have a very large infected kidney stone on the left that will require IV antibiotics and surgery to remove.  Please go to the emergency department at [facility] for further evaluation and admission.

## 2020-04-20 NOTE — ED Provider Notes (Signed)
[facility]    CSN: [id number] Arrival date & time: 04/20/20  1009      History   Chief Complaint Chief Complaint  Patient presents with  . Back Pain  . Abdominal Pain    HPI Laura Adams is a 55 y.o. female.   HPI   55 year old female here for evaluation of back and abdominal pain.  Patient reports that she was at work this morning and suddenly developed pain in her low back that radiates around to the left lower quadrant of her abdomen.  She states that she has had nausea, vomiting, and diarrhea as well.  Patient reports that she was recently on Augmentin for a sinus infection as of last week.  Patient reports that she has not had a fever but she feels chilled and like she cannot get warm at this present time and she also has been feeling fatigued as of today.  Patient reports that she is also having suprapubic pain which started this morning.  Patient states she has not seen any blood in her urine or stool she has not had any painful urination or urinary urgency or frequency.  History reviewed. No pertinent past medical history.  There are no problems to display for this patient.   History reviewed. No pertinent surgical history.  OB History   No obstetric history on file.      Home Medications    Prior to Admission medications   Medication Sig Start Date End Date Taking? Authorizing Provider  gemfibrozil (LOPID) 600 MG tablet Take 600 mg by mouth 2 (two) times daily. 12/26/19   [provider]  hydrochlorothiazide (MICROZIDE) 12.5 MG capsule Take 12.5 mg by mouth daily. 12/26/19   [provider]  niacin (NIASPAN) 500 MG CR tablet Take 500 mg by mouth daily. 12/26/19   [provider]  NORTREL 7/7/7 0.5/0.75/1-35 MG-MCG tablet Take by mouth. 12/26/19   [provider]  omeprazole (PRILOSEC) 20 MG capsule Take 1 capsule by mouth daily. 12/26/19   [provider]  promethazine (PHENERGAN) 25 MG tablet Take 25  mg by mouth every 6 (six) hours as needed. 04/10/20   [provider]  SUMAtriptan (IMITREX) 100 MG tablet Take by mouth. 04/09/20   [provider]    Family History Family History  Problem Relation Age of Onset  . Breast cancer Neg Hx     Social History Social History   Tobacco Use  . Smoking status: Never Smoker  . Smokeless tobacco: Never Used  Vaping Use  . Vaping Use: Never used  Substance Use Topics  . Alcohol use: Not Currently  . Drug use: Never     Allergies   Patient has no known allergies.   Review of Systems Review of Systems  Constitutional: Positive for chills and fatigue. Negative for activity change and fever.  Gastrointestinal: Positive for abdominal pain, diarrhea, nausea and vomiting.  Genitourinary: Negative for dysuria, frequency, hematuria and urgency.  Musculoskeletal: Positive for back pain.  Hematological: Negative.   Psychiatric/Behavioral: Negative.      Physical Exam Triage Vital Signs ED Triage Vitals  Enc Vitals Group     BP 04/20/20 1040 (!) 113/101     Pulse Rate 04/20/20 1040 88     Resp 04/20/20 1040 18     Temp 04/20/20 1040 97.9 F (36.6 C)     Temp Source 04/20/20 1040 Oral     SpO2 04/20/20 1040 99 %     Weight 04/20/20  1037 160 lb (72.6 kg)     Height 04/20/20 1037 5\' 7"  (1.702 m)     Head Circumference --      Peak Flow --      Pain Score 04/20/20 1036 8     Pain Loc --      Pain Edu? --      Excl. in GC? --    No data found.  Updated Vital Signs BP (!) 113/101 (BP Location: Left Arm)   Pulse 88   Temp 97.9 F (36.6 C) (Oral)   Resp 18   Ht 5\' 7"  (1.702 m)   Wt 160 lb (72.6 kg)   LMP 04/05/2020 (Approximate)   SpO2 99%   BMI 25.06 kg/m   Visual Acuity Right Eye Distance:   Left Eye Distance:   Bilateral Distance:    Right Eye Near:   Left Eye Near:    Bilateral Near:     Physical Exam Vitals and nursing note reviewed.  Constitutional:      General: She is not in acute  distress.    Appearance: She is well-developed. She is not ill-appearing.  HENT:     Head: Normocephalic and atraumatic.  Cardiovascular:     Rate and Rhythm: Normal rate and regular rhythm.     Heart sounds: Normal heart sounds. No murmur heard. No gallop.   Pulmonary:     Effort: Pulmonary effort is normal.     Breath sounds: Normal breath sounds. No wheezing, rhonchi or rales.  Abdominal:     General: Abdomen is flat. Bowel sounds are normal. There is no distension.     Palpations: Abdomen is soft. There is no hepatomegaly or splenomegaly.     Tenderness: There is generalized abdominal tenderness. There is no right CVA tenderness or left CVA tenderness.  Skin:    General: Skin is warm and dry.     Capillary Refill: Capillary refill takes less than 2 seconds.     Findings: No erythema or rash.  Neurological:     General: No focal deficit present.     Mental Status: She is alert and oriented to person, place, and time.  Psychiatric:        Mood and Affect: Mood normal.        Behavior: Behavior normal.      UC Treatments / Results  Labs (all labs ordered are listed, but only abnormal results are displayed) Labs Reviewed  URINALYSIS, COMPLETE (UACMP) WITH MICROSCOPIC - Abnormal; Notable for the following components:      Result Value   APPearance HAZY (*)    Hgb urine dipstick MODERATE (*)    Nitrite POSITIVE (*)    Leukocytes,Ua MODERATE (*)    Bacteria, UA MANY (*)    All other components within normal limits  CBC WITH DIFFERENTIAL/PLATELET - Abnormal; Notable for the following components:   WBC 35.9 (*)    Platelets 565 (*)    All other components within normal limits  COMPREHENSIVE METABOLIC PANEL - Abnormal; Notable for the following components:   Potassium 3.2 (*)    Glucose, Bld 112 (*)    All other components within normal limits    EKG   Radiology DG Abdomen 1 View  Result Date: 04/20/2020 CLINICAL DATA:  Left lower quadrant abdominal pain. EXAM:  ABDOMEN - 1 VIEW COMPARISON:  None. FINDINGS: The bowel gas pattern is normal. Large staghorn type left renal calculus is noted. IMPRESSION: No evidence of bowel obstruction or ileus. Large left  renal calculus is noted. Electronically Signed   By: Lupita Raider M.D.   On: 04/20/2020 11:29    Procedures Procedures (including critical care time)  Medications Ordered in UC Medications - No data to display  Initial Impression / Assessment and Plan / UC Course  I have reviewed the triage vital signs and the nursing notes.  Pertinent labs & imaging results that were available during my care of the patient were reviewed by me and considered in my medical decision making (see chart for details).   Patient is a very pleasant 55 year old female here for evaluation of sudden onset of low back pain that radiates into her abdomen.  Patient reports that she primarily feels the pain radiate around into the left lower quadrant of her abdomen and also to her suprapubic area.  Patient has a history of kidney stones and she also is a history of diverticulitis in the past.  She has having diarrhea but denies any blood in her stool and she denies fever.  Patient also denies any blood in her urine.  No burning with urination, urinary urgency or frequency.  Physical exam reveals a benign cardiopulmonary exam.  Abdomen is soft, flat, with diffuse tenderness but the tenderness increases in the left lower quadrant and suprapubic area.  No CVA tenderness.  Differential diagnosis include renal stone versus early diverticulitis.  Will check CBC and CMP along with a UA.  Will also obtain KUB to look for possible renal calculi.  If lab and radiology studies are negative will obtain outpatient CT scan of abdomen pelvis to evaluate for diverticulitis.  CBC shows a white count of 35.9 and platelet count of 565.  Differential is pending.  Urinalysis shows moderate hemoglobin, nitrite positive, moderate leukocytes with 21-50 WBCs  many bacteria and WBCs in clumps.  X-ray shows a presence of a large staghorn renal calculus on the left.  CMP shows mild hypokalemia with a potassium of 3.2, BUN 19, creatinine 0.87.  Transaminases are normal.  Will discharge patient to the emergency department for evaluation.  Report called to Marylene Land the charge nurse at [facility] emergency department.   Final Clinical Impressions(s) / UC Diagnoses   Final diagnoses:  Staghorn kidney stones  Urinary tract infection without hematuria, site unspecified     Discharge Instructions     As we discussed you have a very large infected kidney stone on the left that will require IV antibiotics and surgery to remove.  Please go to the emergency department at [facility] for further evaluation and admission.    ED Prescriptions    None     PDMP not reviewed this encounter.   Becky Augusta, NP 04/20/20 1156

## 2020-04-20 NOTE — Progress Notes (Signed)
Patient sent to OR for procedure.LR infusing .

## 2020-04-20 NOTE — Anesthesia Preprocedure Evaluation (Signed)
Anesthesia Evaluation  Patient identified by MRN, date of birth, ID band Patient awake    Reviewed: Allergy & Precautions, NPO status , Patient's Chart, lab work & pertinent test results  History of Anesthesia Complications Negative for: history of anesthetic complications  Airway Mallampati: II       Dental   Pulmonary neg sleep apnea, neg COPD, Not current smoker,           Cardiovascular (-) hypertension(-) Past MI and (-) CHF (-) dysrhythmias (-) Valvular Problems/Murmurs     Neuro/Psych neg Seizures    GI/Hepatic Neg liver ROS, GERD  Medicated and Controlled,  Endo/Other  neg diabetes  Renal/GU Renal disease (stones)     Musculoskeletal   Abdominal   Peds  Hematology   Anesthesia Other Findings   Reproductive/Obstetrics                             Anesthesia Physical Anesthesia Plan  ASA: II and emergent  Anesthesia Plan: General   Post-op Pain Management:    Induction: Intravenous  PONV Risk Score and Plan: 3 and Ondansetron and Dexamethasone  Airway Management Planned: Oral ETT  Additional Equipment:   Intra-op Plan:   Post-operative Plan:   Informed Consent: I have reviewed the patients History and Physical, chart, labs and discussed the procedure including the risks, benefits and alternatives for the proposed anesthesia with the patient or authorized representative who has indicated his/her understanding and acceptance.       Plan Discussed with:   Anesthesia Plan Comments:         Anesthesia Quick Evaluation

## 2020-04-20 NOTE — ED Triage Notes (Signed)
Pt sent by [facility] for large, obstructive kidney stone on the left side and elevated WBC- per paperwork pt was sent for iv antibiotics and admission

## 2020-04-20 NOTE — ED Notes (Signed)
Informed RN bed assigned 1458

## 2020-04-20 NOTE — Anesthesia Procedure Notes (Signed)
Performed by: Murline Weigel, CRNA       

## 2020-04-20 NOTE — ED Notes (Signed)
Provider at bedside

## 2020-04-20 NOTE — ED Notes (Signed)
Patient is resting comfortably. Call light within reach. Fall precautions in place. Husband at bedside.  

## 2020-04-20 NOTE — ED Provider Notes (Signed)
[facility] Emergency Department Provider Note  Time seen: 1:04 PM  I have reviewed the triage vital signs and the nursing notes.   HISTORY  Chief Complaint Flank Pain   HPI Laura Adams is a 55 y.o. female who presents to the emergency department for acute onset of left flank pain this morning.  According to the patient since this morning she has been experiencing acute sharp pain to her left flank radiating to her left back.  Denies any dysuria or hematuria.  Does state a history of kidney stones previously.  Went to urgent care this morning had an x-ray showing possible kidney stone and a white blood cell count of 35,000 in addition to urinary tract infection.  Patient was referred to the emergency department for further evaluation.  Describes her pain as moderate to severe sharp in nature.  Denies any fever.  States nausea and vomiting this morning but denies any diarrhea.   History reviewed. No pertinent past medical history.  There are no problems to display for this patient.   History reviewed. No pertinent surgical history.  Prior to Admission medications   Medication Sig Start Date End Date Taking? Authorizing Provider  gemfibrozil (LOPID) 600 MG tablet Take 600 mg by mouth 2 (two) times daily. 12/26/19   [provider]  hydrochlorothiazide (MICROZIDE) 12.5 MG capsule Take 12.5 mg by mouth daily. 12/26/19   [provider]  niacin (NIASPAN) 500 MG CR tablet Take 500 mg by mouth daily. 12/26/19   [provider]  NORTREL 7/7/7 0.5/0.75/1-35 MG-MCG tablet Take by mouth. 12/26/19   [provider]  omeprazole (PRILOSEC) 20 MG capsule Take 1 capsule by mouth daily. 12/26/19   [provider]  promethazine (PHENERGAN) 25 MG tablet Take 25 mg by mouth every 6 (six) hours as needed. 04/10/20   [provider]  SUMAtriptan (IMITREX) 100 MG tablet Take by mouth. 04/09/20   [provider]    No  Known Allergies  Family History  Problem Relation Age of Onset  . Breast cancer Neg Hx     Social History Social History   Tobacco Use  . Smoking status: Never Smoker  . Smokeless tobacco: Never Used  Vaping Use  . Vaping Use: Never used  Substance Use Topics  . Alcohol use: Not Currently  . Drug use: Never    Review of Systems Constitutional: Negative for fever. Cardiovascular: Negative for chest pain. Respiratory: Negative for shortness of breath. Gastrointestinal: Left flank pain.  Positive for nausea vomiting. Genitourinary: Negative for urinary compaints Musculoskeletal: Negative for musculoskeletal complaints Neurological: Negative for headache All other ROS negative  ____________________________________________   PHYSICAL EXAM:  VITAL SIGNS: ED Triage Vitals  Enc Vitals Group     BP 04/20/20 1242 128/77     Pulse Rate 04/20/20 1242 91     Resp 04/20/20 1242 16     Temp 04/20/20 1242 97.7 F (36.5 C)     Temp Source 04/20/20 1242 Oral     SpO2 04/20/20 1242 98 %     Weight 04/20/20 1241 160 lb 0.9 oz (72.6 kg)     Height 04/20/20 1241 5\' 7"  (1.702 m)     Head Circumference --      Peak Flow --      Pain Score 04/20/20 1241 10     Pain Loc --      Pain Edu? --      Excl. in GC? --  Constitutional: Alert and oriented. Well appearing and in no distress. Eyes: Normal exam ENT      Head: Normocephalic and atraumatic.      Mouth/Throat: Mucous membranes are moist. Cardiovascular: Normal rate, regular rhythm. No murmur Respiratory: Normal respiratory effort without tachypnea nor retractions. Breath sounds are clear Gastrointestinal: Soft, mild diffuse tenderness with moderate tenderness in left lower quadrant. Musculoskeletal: Nontender with normal range of motion in all extremities.  Neurologic:  Normal speech and language. No gross focal neurologic deficits Skin:  Skin is warm, dry and intact.  Psychiatric: Mood and affect are normal.    ____________________________________________   RADIOLOGY  CT confirms UPJ stone  ____________________________________________   INITIAL IMPRESSION / ASSESSMENT AND PLAN / ED COURSE  Pertinent labs & imaging results that were available during my care of the patient were reviewed by me and considered in my medical decision making (see chart for details).   Patient presents emergency department for acute onset of sharp left-sided flank pain history of kidney stones.  I reviewed the patient's chart from urgent care she had white blood cell count 35,000 had a urinalysis consistent with urinary tract infection and had an abdominal x-ray performed.  We will obtain an abdominal CT renal scan to evaluate for ureterolithiasis or obstructive pathology.  We will start the patient on IV antibiotics and send a urine culture.  Patient agreeable to plan of care.  Differential would include UTI, pyelonephritis, ureterolithiasis.  CT confirms kidney stone.  Spoke to Dr. Alvester Morin of urology who will arrange for a stent.  Spoke to hospitalist for admission.  Patient receiving IV antibiotics.  Remained stable.  Laura Adams was evaluated in Emergency Department on 04/20/2020 for the symptoms described in the history of present illness. She was evaluated in the context of the global COVID-19 pandemic, which necessitated consideration that the patient might be at risk for infection with the SARS-CoV-2 virus that causes COVID-19. Institutional protocols and algorithms that pertain to the evaluation of patients at risk for COVID-19 are in a state of rapid change based on information released by regulatory bodies including the CDC and federal and state organizations. These policies and algorithms were followed during the patient's care in the ED.  ____________________________________________   FINAL CLINICAL IMPRESSION(S) / ED DIAGNOSES  Urinary tract infection Left flank pain   Laura Antis, MD 04/20/20  2038

## 2020-04-20 NOTE — Progress Notes (Signed)
Patient arrived to floor from ED with 10/10 pain.  no pain medication on profile.  Notified MD for new orders   04/20/20 1559  Assess: MEWS Score  Temp 98.9 F (37.2 C)  BP (!) 153/70  Pulse Rate (!) 112  Resp 20  SpO2 99 %  O2 Device Room Air  Assess: MEWS Score  MEWS Temp 0  MEWS Systolic 0  MEWS Pulse 2  MEWS RR 0  MEWS LOC 0  MEWS Score 2  MEWS Score Color Yellow  Treat  MEWS Interventions Escalated (See documentation below)  Pain Scale 0-10  Pain Score 10  Pain Type Acute pain  Pain Intervention(s) MD notified (Comment);Repositioned  Take Vital Signs  Increase Vital Sign Frequency  Yellow: Q 2hr X 2 then Q 4hr X 2, if remains yellow, continue Q 4hrs  Escalate  MEWS: Escalate Yellow: discuss with charge nurse/RN and consider discussing with provider and RRT  Notify: Charge Nurse/RN  Name of Charge Nurse/RN Notified Annabella  Date Charge Nurse/RN Notified 04/20/20  Time Charge Nurse/RN Notified 1600  Notify: Provider  Provider Name/Title cox  Date Provider Notified 04/20/20  Time Provider Notified 1600  Notification Type Page  Notification Reason Change in status  Provider response See new orders  Date of Provider Response 04/20/20  Time of Provider Response 1630

## 2020-04-20 NOTE — Transfer of Care (Signed)
Immediate Anesthesia Transfer of Care Note  Patient: Laura Adams  Procedure(s) Performed: CYSTOSCOPY WITH STENT PLACEMENT (Left )  Patient Location: PACU  Anesthesia Type:General  Level of Consciousness: awake, alert  and oriented  Airway & Oxygen Therapy: Patient Spontanous Breathing  Post-op Assessment: Report given to RN  Post vital signs: Reviewed and stable  Last Vitals:  Vitals Value Taken Time  BP    Temp    Pulse    Resp    SpO2      Last Pain:  Vitals:   04/20/20 1643  TempSrc: Oral  PainSc: 10-Worst pain ever         Complications: No complications documented.

## 2020-04-20 NOTE — Progress Notes (Signed)
   04/20/20 1800  Assess: MEWS Score  Temp 98.4 F (36.9 C)  BP 127/68  Pulse Rate (!) 112  Level of Consciousness Alert  SpO2 96 %  O2 Device Room Air  Take Vital Signs  Increase Vital Sign Frequency  Yellow: Q 2hr X 2 then Q 4hr X 2, if remains yellow, continue Q 4hrs  Document  Patient Outcome Other (Comment) (continued montioring, awaiting procedure)  Progress note created (see row info) Yes  Patient states pain is better but still there, does not want anymore morphine at this time.  Patient awaiting procedure and states is anxious for it to be done. MD aware, no new orders given.  Will continue to monitor and assess pain control.

## 2020-04-20 NOTE — ED Triage Notes (Signed)
Pt c/o lower back pain radiating into her lower abdomen. Pt also reports some nausea and vomiting. Pt states all symptoms started early this morning. Pt denies any urinary symptoms. Pt does report history of kidney stones.

## 2020-04-20 NOTE — ED Notes (Signed)
Pt assisted to restroom. Urine culture collected and sent. Pt resting comfortably in bed. Call light within reach. Fall precautions in place. Husband at bedside.

## 2020-04-21 ENCOUNTER — Encounter: Payer: Self-pay | Admitting: Urology

## 2020-04-21 DIAGNOSIS — A419 Sepsis, unspecified organism: Secondary | ICD-10-CM

## 2020-04-21 DIAGNOSIS — D72829 Elevated white blood cell count, unspecified: Secondary | ICD-10-CM | POA: Diagnosis not present

## 2020-04-21 DIAGNOSIS — N136 Pyonephrosis: Secondary | ICD-10-CM | POA: Diagnosis present

## 2020-04-21 DIAGNOSIS — Z79899 Other long term (current) drug therapy: Secondary | ICD-10-CM | POA: Diagnosis not present

## 2020-04-21 DIAGNOSIS — N132 Hydronephrosis with renal and ureteral calculous obstruction: Secondary | ICD-10-CM | POA: Diagnosis not present

## 2020-04-21 DIAGNOSIS — E876 Hypokalemia: Secondary | ICD-10-CM | POA: Diagnosis present

## 2020-04-21 DIAGNOSIS — A4150 Gram-negative sepsis, unspecified: Secondary | ICD-10-CM | POA: Diagnosis present

## 2020-04-21 DIAGNOSIS — N200 Calculus of kidney: Secondary | ICD-10-CM

## 2020-04-21 DIAGNOSIS — N390 Urinary tract infection, site not specified: Secondary | ICD-10-CM

## 2020-04-21 DIAGNOSIS — Z8679 Personal history of other diseases of the circulatory system: Secondary | ICD-10-CM | POA: Diagnosis not present

## 2020-04-21 DIAGNOSIS — Z20822 Contact with and (suspected) exposure to covid-19: Secondary | ICD-10-CM | POA: Diagnosis present

## 2020-04-21 DIAGNOSIS — Z87442 Personal history of urinary calculi: Secondary | ICD-10-CM | POA: Diagnosis not present

## 2020-04-21 DIAGNOSIS — R109 Unspecified abdominal pain: Secondary | ICD-10-CM | POA: Diagnosis present

## 2020-04-21 DIAGNOSIS — I10 Essential (primary) hypertension: Secondary | ICD-10-CM | POA: Diagnosis present

## 2020-04-21 DIAGNOSIS — N130 Hydronephrosis with ureteropelvic junction obstruction: Secondary | ICD-10-CM | POA: Diagnosis not present

## 2020-04-21 LAB — BASIC METABOLIC PANEL
Anion gap: 9 (ref 5–15)
BUN: 10 mg/dL (ref 6–20)
CO2: 23 mmol/L (ref 22–32)
Calcium: 8.5 mg/dL — ABNORMAL LOW (ref 8.9–10.3)
Chloride: 104 mmol/L (ref 98–111)
Creatinine, Ser: 0.65 mg/dL (ref 0.44–1.00)
GFR, Estimated: >60 mL/min (ref >60)
Glucose, Bld: 101 mg/dL — ABNORMAL HIGH (ref 70–99)
Potassium: 3 mmol/L — ABNORMAL LOW (ref 3.5–5.1)
Sodium: 136 mmol/L (ref 135–145)

## 2020-04-21 LAB — CBC
HCT: 34 % — ABNORMAL LOW (ref 36.0–46.0)
Hemoglobin: 11.5 g/dL — ABNORMAL LOW (ref 12.0–15.0)
MCH: 31.2 pg (ref 26.0–34.0)
MCHC: 33.8 g/dL (ref 30.0–36.0)
MCV: 92.1 fL (ref 80.0–100.0)
Platelets: 394 10*3/uL (ref 150–400)
RBC: 3.69 MIL/uL — ABNORMAL LOW (ref 3.87–5.11)
RDW: 12.5 % (ref 11.5–15.5)
WBC: 36.6 10*3/uL — ABNORMAL HIGH (ref 4.0–10.5)
nRBC: 0 % (ref 0.0–0.2)

## 2020-04-21 LAB — MAGNESIUM: Magnesium: 1.4 mg/dL — ABNORMAL LOW (ref 1.7–2.4)

## 2020-04-21 LAB — LACTIC ACID, PLASMA
Lactic Acid, Venous: 1.1 mmol/L (ref 0.5–1.9)
Lactic Acid, Venous: 1.3 mmol/L (ref 0.5–1.9)

## 2020-04-21 MED ORDER — LACTATED RINGERS IV SOLN: INTRAVENOUS | Status: DC

## 2020-04-21 MED ORDER — MAGNESIUM SULFATE 2 GM/50ML IV SOLN
2.0000 g | Freq: Once | INTRAVENOUS | Status: AC
  Administered 2020-04-21: 2 g via INTRAVENOUS
  Filled 2020-04-21: qty 50

## 2020-04-21 MED ORDER — OXYCODONE HCL 5 MG PO TABS
5.0000 mg | ORAL_TABLET | ORAL | Status: DC | PRN
  Administered 2020-04-21 – 2020-04-24 (×12): 5 mg via ORAL
  Filled 2020-04-21 (×12): qty 1

## 2020-04-21 MED ORDER — PANTOPRAZOLE SODIUM 40 MG PO TBEC
40.0000 mg | DELAYED_RELEASE_TABLET | Freq: Every day | ORAL | Status: DC
  Administered 2020-04-21 – 2020-04-24 (×4): 40 mg via ORAL
  Filled 2020-04-21 (×4): qty 1

## 2020-04-21 MED ORDER — POTASSIUM CHLORIDE CRYS ER 20 MEQ PO TBCR
40.0000 meq | EXTENDED_RELEASE_TABLET | Freq: Two times a day (BID) | ORAL | Status: AC
  Administered 2020-04-21 (×2): 40 meq via ORAL
  Filled 2020-04-21 (×2): qty 2

## 2020-04-21 NOTE — Progress Notes (Signed)
Initial VS taken with patient talking.  Patient having increased pain.  PRN med given with effective relief. Patient rested and VS retaken. Will continue to monitor with q4 vitals.  04/21/20 1604 04/21/20 1616  Assess: MEWS Score  Temp 98.4 F (36.9 C) 98.4 F (36.9 C)  BP 122/76 120/70  Pulse Rate (!) 111  --   ECG Heart Rate  --  (!) 110  Resp  --  18  Level of Consciousness Alert Alert  SpO2 96 % 97 %  O2 Device Room Air Room Air  Assess: MEWS Score  MEWS Temp 0 0  MEWS Systolic 0 0  MEWS Pulse 2 1  MEWS RR 0 0  MEWS LOC 0 0  MEWS Score 2 1  MEWS Score Color Yellow Green  Assess: if the MEWS score is Yellow or Red  Were vital signs taken at a resting state? No Yes  Focused Assessment No change from prior assessment No change from prior assessment  Early Detection of Sepsis Score *See Row Information* Medium Medium  MEWS guidelines implemented *See Row Information* No, vital signs rechecked No, previously red, continue vital signs every 4 hours  Treat  MEWS Interventions  --  Administered prn meds/treatments;Other (Comment) (prn pain med given)  Pain Scale  --  0-10  Pain Score  --  3  Pain Intervention(s)  --  Refused

## 2020-04-21 NOTE — Progress Notes (Signed)
Notified Beth the ICU Charge RN about patient's red mews due to elevated fever 102.7 and elevated heart rate in the 130's.  Also secure chatted Manuela Schwartz NP about elevated fever and heartrate. Anselm Jungling

## 2020-04-21 NOTE — Progress Notes (Signed)
PROGRESS NOTE    Laura Adams  KZL:[id number] DOB: October 20, 1965 DOA: 04/20/2020 PCP: Dortha Kern, MD    Brief Narrative:  55 year old female with history of multiple nephrolithiasis, left-sided staghorn calculi presented to the ER with severe back pain and fever.  In the emergency room temperature 97.7.  Hemodynamically stable.  Urinalysis with leukocytes and positive for nitrates.  CT scan revealed staghorn calculi 3.3 x 3.6 cm in the left renal pelvis, mild to moderate left hydronephrosis and perinephric inflammation.  Obstructive stone UPJ.  Resuscitated and taken to the operating room for cystoscopy and left ureteroscopy.   Assessment & Plan:   Active Problems:   Hydronephrosis with obstructing calculus   Nephrolithiasis   History of hypertension   Leukocytosis   Sepsis (HCC)   Staghorn calculus  Sepsis present on admission .infected renal stone/infected staghorn calculi/obstructive infected left UPJ stone: Hemodynamically stabilizing. Status post cystoscopy, left ureteroscopy by urology. Continue maintenance IV fluids.  Discontinue telemetry.  Mobilize in the hallway.  Oral pain medications. Continue Rocephin IV today. Urine culture and blood cultures pending.  Essential hypertension: Not on treatment.  Blood pressure is stable.  Hypokalemia/hypomagnesemia: Replace aggressively and recheck level tomorrow morning.   DVT prophylaxis: Place TED hose Start: 04/20/20 1454   Code Status: Full code Family Communication: None Disposition Plan: Status is: Observation  The patient will require care spanning > 2 midnights and should be moved to inpatient because: Persistent severe electrolyte disturbances and IV treatments appropriate due to intensity of illness or inability to take PO  Dispo: The patient is from: Home              Anticipated d/c is to: Home              Patient currently is not medically stable to d/c.   Difficult to place patient No          Consultants:   Urology  Procedures:   Cystoscopy and ureteroscopy 4/8, Dr. Alvester Morin  Antimicrobials:   Rocephin, 4/8--   Subjective: Patient seen and examined.  Has some left-sided flank pain otherwise denies any complaints.  She feels better overall today.  Afebrile overnight.  Urine is still dark.  Objective: Vitals:   04/21/20 0532 04/21/20 0627 04/21/20 0720 04/21/20 1105  BP: (!) 115/54 105/60 (!) 107/59 118/65  Pulse: (!) 113 (!) 105 (!) 103 (!) 107  Resp: 20 20  18   Temp: 99.8 F (37.7 C) 98.9 F (37.2 C) 98.1 F (36.7 C) 99.1 F (37.3 C)  TempSrc: Oral Oral Oral Oral  SpO2: 95% 96% 95% 95%  Weight:      Height:        Intake/Output Summary (Last 24 hours) at 04/21/2020 1128 Last data filed at 04/21/2020 0900 Gross per 24 hour  Intake 324.95 ml  Output 1400 ml  Net -1075.05 ml   Filed Weights   04/20/20 1241  Weight: 72.6 kg    Examination:  General exam: Appears calm and comfortable  Respiratory system: Clear to auscultation. Respiratory effort normal. Cardiovascular system: S1 & S2 heard, RRR. No JVD, murmurs, rubs, gallops or clicks. No pedal edema. Gastrointestinal system: Patient does have mild tenderness along the left flank.  No rebound or rigidity.  Bowel sounds present. Central nervous system: Alert and oriented. No focal neurological deficits. Extremities: Symmetric 5 x 5 power. Skin: No rashes, lesions or ulcers Psychiatry: Judgement and insight appear normal. Mood & affect appropriate.     Data Reviewed: I have personally  reviewed following labs and imaging studies  CBC: Recent Labs  Lab 04/20/20 1116 04/21/20 0516  WBC 35.9* 36.6*  NEUTROABS 33.5*  --   HGB 13.5 11.5*  HCT 40.6 34.0*  MCV 93.5 92.1  PLT 565* 394   Basic Metabolic Panel: Recent Labs  Lab 04/20/20 1116 04/21/20 0516  NA 137 136  K 3.2* 3.0*  CL 102 104  CO2 23 23  GLUCOSE 112* 101*  BUN 19 10  CREATININE 0.87 0.65  CALCIUM 9.6 8.5*  MG  --  1.4*    GFR: Estimated Creatinine Clearance: 77.3 mL/min (by C-G formula based on SCr of 0.65 mg/dL). Liver Function Tests: Recent Labs  Lab 04/20/20 1116  AST 28  ALT 31  ALKPHOS 87  BILITOT 0.6  PROT 8.0  ALBUMIN 4.0   No results for input(s): LIPASE, AMYLASE in the last 168 hours. No results for input(s): AMMONIA in the last 168 hours. Coagulation Profile: No results for input(s): INR, PROTIME in the last 168 hours. Cardiac Enzymes: No results for input(s): CKTOTAL, CKMB, CKMBINDEX, TROPONINI in the last 168 hours. BNP (last 3 results) No results for input(s): PROBNP in the last 8760 hours. HbA1C: No results for input(s): HGBA1C in the last 72 hours. CBG: No results for input(s): GLUCAP in the last 168 hours. Lipid Profile: No results for input(s): CHOL, HDL, LDLCALC, TRIG, CHOLHDL, LDLDIRECT in the last 72 hours. Thyroid Function Tests: No results for input(s): TSH, T4TOTAL, FREET4, T3FREE, THYROIDAB in the last 72 hours. Anemia Panel: No results for input(s): VITAMINB12, FOLATE, FERRITIN, TIBC, IRON, RETICCTPCT in the last 72 hours. Sepsis Labs: Recent Labs  Lab 04/21/20 0516 04/21/20 0716  LATICACIDVEN 1.1 1.3    Recent Results (from the past 240 hour(s))  Resp Panel by RT-PCR (Flu A&B, Covid) Nasopharyngeal Swab     Status: None   Collection Time: 04/20/20 12:36 PM   Specimen: Nasopharyngeal Swab; Nasopharyngeal(NP) swabs in vial transport medium  Result Value Ref Range Status   SARS Coronavirus 2 by RT PCR NEGATIVE NEGATIVE Final    Comment: (NOTE) SARS-CoV-2 target nucleic acids are NOT DETECTED.  The SARS-CoV-2 RNA is generally detectable in upper respiratory specimens during the acute phase of infection. The lowest concentration of SARS-CoV-2 viral copies this assay can detect is 138 copies/mL. A negative result does not preclude SARS-Cov-2 infection and should not be used as the sole basis for treatment or other patient management decisions. A negative  result may occur with  improper specimen collection/handling, submission of specimen other than nasopharyngeal swab, presence of viral mutation(s) within the areas targeted by this assay, and inadequate number of viral copies(<138 copies/mL). A negative result must be combined with clinical observations, patient history, and epidemiological information. The expected result is Negative.  Fact Sheet for Patients:  BloggerCourse.comhttps://www.fda.gov/media/152166/download  Fact Sheet for Healthcare Providers:  SeriousBroker.ithttps://www.fda.gov/media/152162/download  This test is no t yet approved or cleared by the [place] FDA and  has been authorized for detection and/or diagnosis of SARS-CoV-2 by FDA under an Emergency Use Authorization (EUA). This EUA will remain  in effect (meaning this test can be used) for the duration of the COVID-19 declaration under Section 564(b)(1) of the Act, 21 U.S.C.section 360bbb-3(b)(1), unless the authorization is terminated  or revoked sooner.       Influenza A by PCR NEGATIVE NEGATIVE Final   Influenza B by PCR NEGATIVE NEGATIVE Final    Comment: (NOTE) The Xpert Xpress SARS-CoV-2/FLU/RSV plus assay is intended as an aid in the  diagnosis of influenza from Nasopharyngeal swab specimens and should not be used as a sole basis for treatment. Nasal washings and aspirates are unacceptable for Xpert Xpress SARS-CoV-2/FLU/RSV testing.  Fact Sheet for Patients: BloggerCourse.com  Fact Sheet for Healthcare Providers: SeriousBroker.it  This test is not yet approved or cleared by the [place] FDA and has been authorized for detection and/or diagnosis of SARS-CoV-2 by FDA under an Emergency Use Authorization (EUA). This EUA will remain in effect (meaning this test can be used) for the duration of the COVID-19 declaration under Section 564(b)(1) of the Act, 21 U.S.C. section 360bbb-3(b)(1), unless the authorization is  terminated or revoked.  Performed at [facility], [address] [place], [place] [address]   Culture, blood (routine x 2)     Status: None (Preliminary result)   Collection Time: 04/20/20  9:15 PM   Specimen: BLOOD  Result Value Ref Range Status   Specimen Description BLOOD BLOOD LEFT HAND  Final   Special Requests   Final    BOTTLES DRAWN AEROBIC AND ANAEROBIC Blood Culture results may not be optimal due to an excessive volume of blood received in culture bottles   Culture   Final    NO GROWTH < 12 HOURS Performed at [facility], [address], [place], [place] [address]    Report Status PENDING  Incomplete  Culture, blood (routine x 2)     Status: None (Preliminary result)   Collection Time: 04/20/20  9:15 PM   Specimen: BLOOD  Result Value Ref Range Status   Specimen Description BLOOD BLOOD RIGHT HAND  Final   Special Requests   Final    BOTTLES DRAWN AEROBIC AND ANAEROBIC Blood Culture results may not be optimal due to an excessive volume of blood received in culture bottles   Culture   Final    NO GROWTH < 12 HOURS Performed at [facility], [address], [place], [place] [address]    Report Status PENDING  Incomplete         Radiology Studies: DG Abdomen 1 View  Result Date: 04/20/2020 CLINICAL DATA:  Left lower quadrant abdominal pain. EXAM: ABDOMEN - 1 VIEW COMPARISON:  None. FINDINGS: The bowel gas pattern is normal. Large staghorn type left renal calculus is noted. IMPRESSION: No evidence of bowel obstruction or ileus. Large left renal calculus is noted. Electronically Signed   By: Lupita Raider M.D.   On: 04/20/2020 11:29   DG OR UROLOGY CYSTO IMAGE ([facility] ONLY)  Result Date: 04/20/2020 There is no interpretation for this exam.  This order is for images obtained during a surgical procedure.  Please See "Surgeries" Tab for more information regarding the procedure.   CT Renal Stone Study  Result Date: 04/20/2020 CLINICAL DATA:   Low back pain radiates abdomen. Nausea vomiting. History of kidney stones. EXAM: CT ABDOMEN AND PELVIS WITHOUT CONTRAST TECHNIQUE: Multidetector CT imaging of the abdomen and pelvis was performed following the standard protocol without IV contrast. COMPARISON:  None. FINDINGS: Lower chest: Unremarkable. Hepatobiliary: No focal abnormality in the liver on this study without intravenous contrast. There is no evidence for gallstones, gallbladder wall thickening, or pericholecystic fluid. No intrahepatic or extrahepatic biliary dilation. Pancreas: No focal mass lesion. No dilatation of the main duct. No intraparenchymal cyst. No peripancreatic edema. Spleen: No splenomegaly. No focal mass lesion. Adrenals/Urinary Tract: No adrenal nodule or mass. 1 mm nonobstructing stone noted upper pole right kidney (best seen on coronal image 95/series 5). No right  ureteral stone. No secondary changes in the right kidney or ureter. Large staghorn calculus (approximately 3.3 x 3.0 x 3.6 cm) identified left renal pelvis with mild to moderate left hydronephrosis and perinephric and peripelvic edema/inflammation. Tiny gas bubbles are seen in an interpolar calyx and in the renal pelvis near the UPJ. Additional stones are seen scattered in the left kidney with an obstructing stone at the UPJ measuring 7 x 6 x 11 mm. Left ureter unremarkable. The urinary bladder appears normal for the degree of distention. Stomach/Bowel: Stomach is unremarkable. No gastric wall thickening. No evidence of outlet obstruction. Duodenum is normally positioned as is the ligament of Treitz. No small bowel wall thickening. No small bowel dilatation. The terminal ileum is normal. The appendix is normal. No gross colonic mass. No colonic wall thickening. Vascular/Lymphatic: No abdominal aortic aneurysm. There is no gastrohepatic or hepatoduodenal ligament lymphadenopathy. Borderline lymphadenopathy noted left para-aortic space with 11 mm short axis node visible on  image 33/2. No pelvic sidewall lymphadenopathy. Reproductive: The uterus is unremarkable.  There is no adnexal mass. Other: No intraperitoneal free fluid. Musculoskeletal: Advanced degenerative changes noted right hip No worrisome lytic or sclerotic osseous abnormality. IMPRESSION: 1. 7 x 6 x 11 mm obstructing stone at the left UPJ. Moderate dilatation of the left intrarenal collecting system with tiny gas bubble seen in an interpolar calyx and in the low renal pelvis, features compatible with pyonephrosis. 2. Very large staghorn calculus identified in the right renal pelvis extending into interpolar calices. Additional scattered stones noted in the left kidney. 3. Advanced degenerative changes right hip. 4. Emphysema (ICD10-J43.9). Electronically Signed   By: Kennith Center M.D.   On: 04/20/2020 13:51        Scheduled Meds: . potassium chloride  40 mEq Oral BID  . scopolamine  1 patch Transdermal Once   Continuous Infusions: . cefTRIAXone (ROCEPHIN)  IV 1 g (04/21/20 1111)  . lactated ringers 125 mL/hr at 04/20/20 2321  . magnesium sulfate bolus IVPB       LOS: 0 days    Time spent: 35 minutes    Dorcas Carrow, MD [facility] Pager [phone]

## 2020-04-21 NOTE — Plan of Care (Signed)

## 2020-04-21 NOTE — Progress Notes (Signed)
1 Day Post-Op Subjective: Denies pain.  No nausea or emesis.  Temperature curve downtrending, now afebrile.  Voiding clear yellow urine.  Objective: Vital signs in last 24 hours: Temp:  [98.1 F (36.7 C)-102.7 F (39.3 C)] 99.1 F (37.3 C) (04/09 1105) Pulse Rate:  [93-131] 107 (04/09 1105) Resp:  [13-20] 18 (04/09 1105) BP: (105-153)/(51-80) 118/65 (04/09 1105) SpO2:  [93 %-99 %] 95 % (04/09 1105)  Intake/Output from previous day: 04/08 0701 - 04/09 0700 In: 205 [I.V.:105; IV Piggyback:100] Out: 600 [Urine:600] Intake/Output this shift: Total I/O In: 1746.5 [P.O.:120; I.V.:1526.5; IV Piggyback:100] Out: 800 [Urine:800]   UOP: 600 female  Physical Exam:  General: Alert and oriented CV: RRR Lungs: Clear Abdomen: Soft, ND, nontender Ext: NT, No erythema  Lab Results: Recent Labs    04/20/20 1116 04/21/20 0516  HGB 13.5 11.5*  HCT 40.6 34.0*   BMET Recent Labs    04/20/20 1116 04/21/20 0516  NA 137 136  K 3.2* 3.0*  CL 102 104  CO2 23 23  GLUCOSE 112* 101*  BUN 19 10  CREATININE 0.87 0.65  CALCIUM 9.6 8.5*     Studies/Results: DG Abdomen 1 View  Result Date: 04/20/2020 CLINICAL DATA:  Left lower quadrant abdominal pain. EXAM: ABDOMEN - 1 VIEW COMPARISON:  None. FINDINGS: The bowel gas pattern is normal. Large staghorn type left renal calculus is noted. IMPRESSION: No evidence of bowel obstruction or ileus. Large left renal calculus is noted. Electronically Signed   By: Lupita Raider M.D.   On: 04/20/2020 11:29   DG OR UROLOGY CYSTO IMAGE ([facility] ONLY)  Result Date: 04/20/2020 There is no interpretation for this exam.  This order is for images obtained during a surgical procedure.  Please See "Surgeries" Tab for more information regarding the procedure.   CT Renal Stone Study  Result Date: 04/20/2020 CLINICAL DATA:  Low back pain radiates abdomen. Nausea vomiting. History of kidney stones. EXAM: CT ABDOMEN AND PELVIS WITHOUT CONTRAST TECHNIQUE: Multidetector  CT imaging of the abdomen and pelvis was performed following the standard protocol without IV contrast. COMPARISON:  None. FINDINGS: Lower chest: Unremarkable. Hepatobiliary: No focal abnormality in the liver on this study without intravenous contrast. There is no evidence for gallstones, gallbladder wall thickening, or pericholecystic fluid. No intrahepatic or extrahepatic biliary dilation. Pancreas: No focal mass lesion. No dilatation of the main duct. No intraparenchymal cyst. No peripancreatic edema. Spleen: No splenomegaly. No focal mass lesion. Adrenals/Urinary Tract: No adrenal nodule or mass. 1 mm nonobstructing stone noted upper pole right kidney (best seen on coronal image 95/series 5). No right ureteral stone. No secondary changes in the right kidney or ureter. Large staghorn calculus (approximately 3.3 x 3.0 x 3.6 cm) identified left renal pelvis with mild to moderate left hydronephrosis and perinephric and peripelvic edema/inflammation. Tiny gas bubbles are seen in an interpolar calyx and in the renal pelvis near the UPJ. Additional stones are seen scattered in the left kidney with an obstructing stone at the UPJ measuring 7 x 6 x 11 mm. Left ureter unremarkable. The urinary bladder appears normal for the degree of distention. Stomach/Bowel: Stomach is unremarkable. No gastric wall thickening. No evidence of outlet obstruction. Duodenum is normally positioned as is the ligament of Treitz. No small bowel wall thickening. No small bowel dilatation. The terminal ileum is normal. The appendix is normal. No gross colonic mass. No colonic wall thickening. Vascular/Lymphatic: No abdominal aortic aneurysm. There is no gastrohepatic or hepatoduodenal ligament lymphadenopathy. Borderline lymphadenopathy noted left  para-aortic space with 11 mm short axis node visible on image 33/2. No pelvic sidewall lymphadenopathy. Reproductive: The uterus is unremarkable.  There is no adnexal mass. Other: No intraperitoneal  free fluid. Musculoskeletal: Advanced degenerative changes noted right hip No worrisome lytic or sclerotic osseous abnormality. IMPRESSION: 1. 7 x 6 x 11 mm obstructing stone at the left UPJ. Moderate dilatation of the left intrarenal collecting system with tiny gas bubble seen in an interpolar calyx and in the low renal pelvis, features compatible with pyonephrosis. 2. Very large staghorn calculus identified in the right renal pelvis extending into interpolar calices. Additional scattered stones noted in the left kidney. 3. Advanced degenerative changes right hip. 4. Emphysema (ICD10-J43.9). Electronically Signed   By: Kennith Center M.D.   On: 04/20/2020 13:51    Assessment/Plan: 1. Obstructing left UPJ stone with sign of infection: S/p cystoscopy, left ureteral stent placement on 4/day/2022 by Dr. Alvester Morin. 2. Large staghorn calculus in the left renal pelvis 3. Sepsis due to obstructing stone: Resolving.  Presented febrile, leukocytosis of 36, urinalysis with positive nitrite, moderate leukocyte esterase.  -Patient looks well after left sided decompression.  Now afebrile.  Persistent leukocytosis however no pain. -Continue broad-spectrum antibiotics -Blood culture no growth for 12 hours -Follow-up urine culture.  Will need to treat with culture specific antibiotics for at least 2 weeks. -We will need definitive treatment of stone as an outpatient.  This will require a left-sided PCNL.  Patient prefers to follow-up with Dr. Richardo Hanks as her husband already sees him. -I messaged schedulers to arrange follow-up. -Following peripherally.  Please call with questions.   LOS: 0 days   Matt R. Everli Rother MD 04/21/2020, 12:57 PM [facility]  Pager: [phone]

## 2020-04-21 NOTE — Plan of Care (Signed)
  Problem: Clinical Measurements: Goal: Ability to maintain clinical measurements within normal limits will improve Outcome: Progressing   Problem: Clinical Measurements: Goal: Cardiovascular complication will be avoided Outcome: Progressing  Patient continues to improve with tachycardia with effective pain control.  Patient eating and drinking well, ambulates to restroom independently.  Will continue to monitor.

## 2020-04-21 NOTE — Progress Notes (Signed)
   04/21/20 0339  Assess: MEWS Score  Temp (!) 102.7 F (39.3 C) (I notified the nurse Tamous)  BP 126/63  Pulse Rate (!) 131 (I notified the nurse Marialena Wollen)  Resp 20  SpO2 93 %  O2 Device Room Air  Assess: MEWS Score  MEWS Temp 2  MEWS Systolic 0  MEWS Pulse 3  MEWS RR 0  MEWS LOC 0  MEWS Score 5  MEWS Score Color Red  Assess: if the MEWS score is Yellow or Red  Were vital signs taken at a resting state? Yes  Focused Assessment Change from prior assessment (see assessment flowsheet)  Early Detection of Sepsis Score *See Row Information* Low  MEWS guidelines implemented *See Row Information* Yes  Treat  MEWS Interventions Administered prn meds/treatments;Escalated (See documentation below)  Pain Scale 0-10  Pain Score 8  Pain Type Surgical pain  Pain Location Flank  Pain Orientation Left  Pain Radiating Towards  (BACK)  Pain Descriptors / Indicators Shooting;Sharp  Pain Onset Awakened from sleep  Patients Stated Pain Goal 0  Pain Intervention(s) Medication (See eMAR)  Take Vital Signs  Increase Vital Sign Frequency  Red: Q 1hr X 4 then Q 4hr X 4, if remains red, continue Q 4hrs  Escalate  MEWS: Escalate Red: discuss with charge nurse/RN and provider, consider discussing with RRT  Notify: Charge Nurse/RN  Name of Charge Nurse/RN Notified Thedora Hinders  Date Charge Nurse/RN Notified 04/21/20  Time Charge Nurse/RN Notified 0348  Notify: Provider  Provider Name/Title Leslee Home (Simultaneous filing. User may not have seen previous data.)  Date Provider Notified 04/21/20 (Simultaneous filing. User may not have seen previous data.)  Time Provider Notified [phone] (Simultaneous filing. User may not have seen previous data.)  Notification Type Page (Simultaneous filing. User may not have seen previous data.)  Notification Reason Change in status (Simultaneous filing. User may not have seen previous data.)  Provider response Other (Comment) (awaiting a response)   Date of Provider Response 04/20/20  Time of Provider Response 0403  Document  Patient Outcome Stabilized after interventions  Progress note created (see row info) Yes

## 2020-04-22 LAB — URINE CULTURE

## 2020-04-22 LAB — CBC WITH DIFFERENTIAL/PLATELET
Abs Immature Granulocytes: 0.37 10*3/uL — ABNORMAL HIGH (ref 0.00–0.07)
Basophils Absolute: 0.1 10*3/uL (ref 0.0–0.1)
Basophils Relative: 0 %
Eosinophils Absolute: 0.1 10*3/uL (ref 0.0–0.5)
Eosinophils Relative: 1 %
HCT: 32.2 % — ABNORMAL LOW (ref 36.0–46.0)
Hemoglobin: 10.6 g/dL — ABNORMAL LOW (ref 12.0–15.0)
Immature Granulocytes: 1 %
Lymphocytes Relative: 6 %
Lymphs Abs: 1.7 10*3/uL (ref 0.7–4.0)
MCH: 31 pg (ref 26.0–34.0)
MCHC: 32.9 g/dL (ref 30.0–36.0)
MCV: 94.2 fL (ref 80.0–100.0)
Monocytes Absolute: 1 10*3/uL (ref 0.1–1.0)
Monocytes Relative: 3 %
Neutro Abs: 25 10*3/uL — ABNORMAL HIGH (ref 1.7–7.7)
Neutrophils Relative %: 89 %
Platelets: 365 10*3/uL (ref 150–400)
RBC: 3.42 MIL/uL — ABNORMAL LOW (ref 3.87–5.11)
RDW: 12.8 % (ref 11.5–15.5)
WBC Morphology: INCREASED
WBC: 28.2 10*3/uL — ABNORMAL HIGH (ref 4.0–10.5)
nRBC: 0 % (ref 0.0–0.2)

## 2020-04-22 LAB — BLOOD CULTURE ID PANEL (REFLEXED) - BCID2

## 2020-04-22 LAB — PHOSPHORUS: Phosphorus: 2.2 mg/dL — ABNORMAL LOW (ref 2.5–4.6)

## 2020-04-22 LAB — BASIC METABOLIC PANEL
Anion gap: 7 (ref 5–15)
BUN: 9 mg/dL (ref 6–20)
CO2: 23 mmol/L (ref 22–32)
Calcium: 8.5 mg/dL — ABNORMAL LOW (ref 8.9–10.3)
Chloride: 109 mmol/L (ref 98–111)
Creatinine, Ser: 0.6 mg/dL (ref 0.44–1.00)
GFR, Estimated: >60 mL/min (ref >60)
Glucose, Bld: 102 mg/dL — ABNORMAL HIGH (ref 70–99)
Potassium: 4 mmol/L (ref 3.5–5.1)
Sodium: 139 mmol/L (ref 135–145)

## 2020-04-22 LAB — MAGNESIUM: Magnesium: 1.9 mg/dL (ref 1.7–2.4)

## 2020-04-22 MED ORDER — SODIUM CHLORIDE 0.9 % IV SOLN
2.0000 g | INTRAVENOUS | Status: DC
  Administered 2020-04-22 – 2020-04-24 (×3): 2 g via INTRAVENOUS
  Filled 2020-04-22 (×2): qty 20
  Filled 2020-04-22 (×2): qty 2

## 2020-04-22 MED ORDER — K PHOS MONO-SOD PHOS DI & MONO 155-852-130 MG PO TABS
250.0000 mg | ORAL_TABLET | Freq: Three times a day (TID) | ORAL | Status: DC
  Administered 2020-04-22 – 2020-04-24 (×7): 250 mg via ORAL
  Filled 2020-04-22 (×9): qty 1

## 2020-04-22 NOTE — Plan of Care (Signed)
Continuing with plan of care. 

## 2020-04-22 NOTE — Progress Notes (Signed)
PHARMACY - PHYSICIAN COMMUNICATION CRITICAL VALUE ALERT - BLOOD CULTURE IDENTIFICATION (BCID)   BCID: 1 of 4 bottles w/ Klebsiella pneumoniae, no resistance  Name of physician (or Provider) ContactedCliffton Asters, NP  Changes to prescribed antibiotics required: Increased Ceftriaxone dose to 2 gm q24hr.  Otelia Sergeant, PharmD, [facility] 04/22/2020 1:45 AM

## 2020-04-22 NOTE — Progress Notes (Signed)
PROGRESS NOTE    Laura Adams  ZOX:[id number] DOB: October 18, 1965 DOA: 04/20/2020 PCP: Dortha Kern, MD    Brief Narrative:  55 year old female with history of multiple nephrolithiasis, left-sided staghorn calculi presented to the ER with severe back pain and fever.  In the emergency room temperature 97.7.  Hemodynamically stable.  Urinalysis with leukocytes and positive for nitrates.  CT scan revealed staghorn calculi 3.3 x 3.6 cm in the left renal pelvis, mild to moderate left hydronephrosis and perinephric inflammation.  Obstructive stone UPJ.  Resuscitated and taken to the operating room for cystoscopy and left ureteroscopy.   Assessment & Plan:   Active Problems:   Hydronephrosis with obstructing calculus   Nephrolithiasis   History of hypertension   Leukocytosis   Sepsis (HCC)   Staghorn calculus   Renal stone  Gram-negative bacteremia, sepsis present on admission .infected renal stone/infected staghorn calculi/obstructive infected left UPJ stone: Hemodynamically normalizing. Status post cystoscopy, left ureteroscopy by urology 4/8. Continue Rocephin high doses for positive blood cultures. Urine culture with mixed flora, blood culture with Klebsiella.  Will treat with 10 days of therapy for complicated UTI.  Will change to oral probably by tomorrow.  Essential hypertension: Not on treatment.  Blood pressure is stable.  Hypokalemia/hypomagnesemia: Replaced and normalized.  Mobilize.  Discontinue cardiac monitor.  Anticipate discharge home tomorrow.   DVT prophylaxis: Place TED hose Start: 04/20/20 1454   Code Status: Full code Family Communication: None, patient is communicating. Disposition Plan: Status is: Inpatient.   Dispo: The patient is from: Home              Anticipated d/c is to: Home              Patient currently is not medically stable to d/c.   Difficult to place patient No         Consultants:   Urology  Procedures:   Cystoscopy and  ureteroscopy 4/8, Dr. Alvester Morin  Antimicrobials:   Rocephin, 4/8--   Subjective: Patient seen and examined.  Remains afebrile overnight.  Left flank pain is present but somehow better than yesterday.  Objective: Vitals:   04/21/20 1823 04/21/20 2003 04/21/20 2230 04/22/20 0543  BP:  (!) 108/54 120/66 126/61  Pulse:  (!) 108 (!) 109 99  Resp:  Temp: 100 F (37.8 C) 98.7 F (37.1 C) 98.7 F (37.1 C) 98.7 F (37.1 C)  TempSrc: Oral Oral Oral Oral  SpO2:  93% 97% 97%  Weight:      Height:        Intake/Output Summary (Last 24 hours) at 04/22/2020 1051 Last data filed at 04/22/2020 0900 Gross per 24 hour  Intake 3373.35 ml  Output 3100 ml  Net 273.35 ml   Filed Weights   04/20/20 1241  Weight: 72.6 kg    Examination:  General exam: Appears calm and comfortable  Respiratory system: Clear to auscultation. Respiratory effort normal. Cardiovascular system: S1 & S2 heard, RRR. No JVD, murmurs, rubs, gallops or clicks. No pedal edema. Gastrointestinal system: Patient does have mild tenderness along the left flank.  No rebound or rigidity.  Bowel sounds present. Central nervous system: Alert and oriented. No focal neurological deficits. Extremities: Symmetric 5 x 5 power. Skin: No rashes, lesions or ulcers Psychiatry: Judgement and insight appear normal. Mood & affect appropriate.     Data Reviewed: I have personally reviewed following labs and imaging studies  CBC: Recent Labs  Lab 04/20/20 1116 04/21/20 0516 04/22/20 0443  WBC 35.9* 36.6* 28.2*  NEUTROABS 33.5*  --  25.0*  HGB 13.5 11.5* 10.6*  HCT 40.6 34.0* 32.2*  MCV 93.5 92.1 94.2  PLT 565* 394 365   Basic Metabolic Panel: Recent Labs  Lab 04/20/20 1116 04/21/20 0516 04/22/20 0443  NA 137 136 139  K 3.2* 3.0* 4.0  CL 102 104 109  CO2 23 23 23   GLUCOSE 112* 101* 102*  BUN 19 10 9   CREATININE 0.87 0.65 0.60  CALCIUM 9.6 8.5* 8.5*  MG  --  1.4* 1.9  PHOS  --   --  2.2*   GFR: Estimated  Creatinine Clearance: 77.3 mL/min (by C-G formula based on SCr of 0.6 mg/dL). Liver Function Tests: Recent Labs  Lab 04/20/20 1116  AST 28  ALT 31  ALKPHOS 87  BILITOT 0.6  PROT 8.0  ALBUMIN 4.0   No results for input(s): LIPASE, AMYLASE in the last 168 hours. No results for input(s): AMMONIA in the last 168 hours. Coagulation Profile: No results for input(s): INR, PROTIME in the last 168 hours. Cardiac Enzymes: No results for input(s): CKTOTAL, CKMB, CKMBINDEX, TROPONINI in the last 168 hours. BNP (last 3 results) No results for input(s): PROBNP in the last 8760 hours. HbA1C: No results for input(s): HGBA1C in the last 72 hours. CBG: No results for input(s): GLUCAP in the last 168 hours. Lipid Profile: No results for input(s): CHOL, HDL, LDLCALC, TRIG, CHOLHDL, LDLDIRECT in the last 72 hours. Thyroid Function Tests: No results for input(s): TSH, T4TOTAL, FREET4, T3FREE, THYROIDAB in the last 72 hours. Anemia Panel: No results for input(s): VITAMINB12, FOLATE, FERRITIN, TIBC, IRON, RETICCTPCT in the last 72 hours. Sepsis Labs: Recent Labs  Lab 04/21/20 0516 04/21/20 0716  LATICACIDVEN 1.1 1.3    Recent Results (from the past 240 hour(s))  Urine Culture     Status: Abnormal   Collection Time: 04/20/20 11:16 AM   Specimen: Urine, Random  Result Value Ref Range Status   Specimen Description   Final    URINE, RANDOM Performed at [facility], [address], [place], [place]    Special Requests   Final    NONE Performed at [facility], [address] [place], [address] [place]    Culture MULTIPLE SPECIES PRESENT, SUGGEST RECOLLECTION (A)  Final   Report Status 04/22/2020 FINAL  Final  Resp Panel by RT-PCR (Flu A&B, Covid) Nasopharyngeal Swab     Status: None   Collection Time: 04/20/20 12:36 PM   Specimen: Nasopharyngeal Swab; Nasopharyngeal(NP) swabs in vial transport medium  Result Value Ref Range Status   SARS Coronavirus 2 by  RT PCR NEGATIVE NEGATIVE Final    Comment: (NOTE) SARS-CoV-2 target nucleic acids are NOT DETECTED.  The SARS-CoV-2 RNA is generally detectable in upper respiratory specimens during the acute phase of infection. The lowest concentration of SARS-CoV-2 viral copies this assay can detect is 138 copies/mL. A negative result does not preclude SARS-Cov-2 infection and should not be used as the sole basis for treatment or other patient management decisions. A negative result may occur with  improper specimen collection/handling, submission of specimen other than nasopharyngeal swab, presence of viral mutation(s) within the areas targeted by this assay, and inadequate number of viral copies(<138 copies/mL). A negative result must be combined with clinical observations, patient history, and epidemiological information. The expected result is Negative.  Fact Sheet for Patients:  06/22/2020  Fact Sheet for Healthcare Providers:  06/20/20  This test is no t yet  approved or cleared by the [place] FDA and  has been authorized for detection and/or diagnosis of SARS-CoV-2 by FDA under an Emergency Use Authorization (EUA). This EUA will remain  in effect (meaning this test can be used) for the duration of the COVID-19 declaration under Section 564(b)(1) of the Act, 21 U.S.C.section 360bbb-3(b)(1), unless the authorization is terminated  or revoked sooner.       Influenza A by PCR NEGATIVE NEGATIVE Final   Influenza B by PCR NEGATIVE NEGATIVE Final    Comment: (NOTE) The Xpert Xpress SARS-CoV-2/FLU/RSV plus assay is intended as an aid in the diagnosis of influenza from Nasopharyngeal swab specimens and should not be used as a sole basis for treatment. Nasal washings and aspirates are unacceptable for Xpert Xpress SARS-CoV-2/FLU/RSV testing.  Fact Sheet for Patients: BloggerCourse.comhttps://www.fda.gov/media/152166/download  Fact Sheet  for Healthcare Providers: SeriousBroker.ithttps://www.fda.gov/media/152162/download  This test is not yet approved or cleared by the [place] FDA and has been authorized for detection and/or diagnosis of SARS-CoV-2 by FDA under an Emergency Use Authorization (EUA). This EUA will remain in effect (meaning this test can be used) for the duration of the COVID-19 declaration under Section 564(b)(1) of the Act, 21 U.S.C. section 360bbb-3(b)(1), unless the authorization is terminated or revoked.  Performed at [facility], [address] [place], [place] [address]   Culture, blood (routine x 2)     Status: None (Preliminary result)   Collection Time: 04/20/20  9:15 PM   Specimen: BLOOD  Result Value Ref Range Status   Specimen Description BLOOD BLOOD LEFT HAND  Final   Special Requests   Final    BOTTLES DRAWN AEROBIC AND ANAEROBIC Blood Culture results may not be optimal due to an excessive volume of blood received in culture bottles   Culture  Setup Time   Final    Organism ID to follow GRAM NEGATIVE RODS ANAEROBIC BOTTLE ONLY CRITICAL RESULT CALLED TO, READ BACK BY AND VERIFIED WITH: NATHAN BELUE AT 0132 04/22/20. MF Performed at [facility], [address] [place], [place] [address]    Culture GRAM NEGATIVE RODS  Final   Report Status PENDING  Incomplete  Culture, blood (routine x 2)     Status: None (Preliminary result)   Collection Time: 04/20/20  9:15 PM   Specimen: BLOOD  Result Value Ref Range Status   Specimen Description BLOOD BLOOD RIGHT HAND  Final   Special Requests   Final    BOTTLES DRAWN AEROBIC AND ANAEROBIC Blood Culture results may not be optimal due to an excessive volume of blood received in culture bottles   Culture   Final    NO GROWTH 2 DAYS Performed at [facility], [address] [place], [place] [address]    Report Status PENDING  Incomplete  Blood Culture ID Panel (Reflexed)     Status: Abnormal   Collection Time: 04/20/20  9:15  PM  Result Value Ref Range Status   Enterococcus faecalis NOT DETECTED NOT DETECTED Final   Enterococcus Faecium NOT DETECTED NOT DETECTED Final   Listeria monocytogenes NOT DETECTED NOT DETECTED Final   Staphylococcus species NOT DETECTED NOT DETECTED Final   Staphylococcus aureus (BCID) NOT DETECTED NOT DETECTED Final   Staphylococcus epidermidis NOT DETECTED NOT DETECTED Final   Staphylococcus lugdunensis NOT DETECTED NOT DETECTED Final   Streptococcus species NOT DETECTED NOT DETECTED Final   Streptococcus agalactiae NOT DETECTED NOT DETECTED Final   Streptococcus pneumoniae NOT DETECTED NOT DETECTED Final   Streptococcus  pyogenes NOT DETECTED NOT DETECTED Final   A.calcoaceticus-baumannii NOT DETECTED NOT DETECTED Final   Bacteroides fragilis NOT DETECTED NOT DETECTED Final   Enterobacterales DETECTED (A) NOT DETECTED Final    Comment: Enterobacterales represent a large order of gram negative bacteria, not a single organism. CRITICAL RESULT CALLED TO, READ BACK BY AND VERIFIED WITH: NATHAN BELUE AT 0132 04/22/20 MF.    Enterobacter cloacae complex NOT DETECTED NOT DETECTED Final   Escherichia coli NOT DETECTED NOT DETECTED Final   Klebsiella aerogenes NOT DETECTED NOT DETECTED Final   Klebsiella oxytoca NOT DETECTED NOT DETECTED Final   Klebsiella pneumoniae DETECTED (A) NOT DETECTED Final    Comment: CRITICAL RESULT CALLED TO, READ BACK BY AND VERIFIED WITH: NATHAN BELUE AT 0132 04/22/20 MF.    Proteus species NOT DETECTED NOT DETECTED Final   Salmonella species NOT DETECTED NOT DETECTED Final   Serratia marcescens NOT DETECTED NOT DETECTED Final   Haemophilus influenzae NOT DETECTED NOT DETECTED Final   Neisseria meningitidis NOT DETECTED NOT DETECTED Final   Pseudomonas aeruginosa NOT DETECTED NOT DETECTED Final   Stenotrophomonas maltophilia NOT DETECTED NOT DETECTED Final   Candida albicans NOT DETECTED NOT DETECTED Final   Candida auris NOT DETECTED NOT DETECTED Final    Candida glabrata NOT DETECTED NOT DETECTED Final   Candida krusei NOT DETECTED NOT DETECTED Final   Candida parapsilosis NOT DETECTED NOT DETECTED Final   Candida tropicalis NOT DETECTED NOT DETECTED Final   Cryptococcus neoformans/gattii NOT DETECTED NOT DETECTED Final   CTX-M ESBL NOT DETECTED NOT DETECTED Final   Carbapenem resistance IMP NOT DETECTED NOT DETECTED Final   Carbapenem resistance KPC NOT DETECTED NOT DETECTED Final   Carbapenem resistance NDM NOT DETECTED NOT DETECTED Final   Carbapenem resist OXA 48 LIKE NOT DETECTED NOT DETECTED Final   Carbapenem resistance VIM NOT DETECTED NOT DETECTED Final    Comment: Performed at [facility], [address] [place], [place] [address]         Radiology Studies: DG Abdomen 1 View  Result Date: 04/20/2020 CLINICAL DATA:  Left lower quadrant abdominal pain. EXAM: ABDOMEN - 1 VIEW COMPARISON:  None. FINDINGS: The bowel gas pattern is normal. Large staghorn type left renal calculus is noted. IMPRESSION: No evidence of bowel obstruction or ileus. Large left renal calculus is noted. Electronically Signed   By: Lupita Raider M.D.   On: 04/20/2020 11:29   DG OR UROLOGY CYSTO IMAGE ([facility] ONLY)  Result Date: 04/20/2020 There is no interpretation for this exam.  This order is for images obtained during a surgical procedure.  Please See "Surgeries" Tab for more information regarding the procedure.   CT Renal Stone Study  Result Date: 04/20/2020 CLINICAL DATA:  Low back pain radiates abdomen. Nausea vomiting. History of kidney stones. EXAM: CT ABDOMEN AND PELVIS WITHOUT CONTRAST TECHNIQUE: Multidetector CT imaging of the abdomen and pelvis was performed following the standard protocol without IV contrast. COMPARISON:  None. FINDINGS: Lower chest: Unremarkable. Hepatobiliary: No focal abnormality in the liver on this study without intravenous contrast. There is no evidence for gallstones, gallbladder wall thickening, or pericholecystic  fluid. No intrahepatic or extrahepatic biliary dilation. Pancreas: No focal mass lesion. No dilatation of the main duct. No intraparenchymal cyst. No peripancreatic edema. Spleen: No splenomegaly. No focal mass lesion. Adrenals/Urinary Tract: No adrenal nodule or mass. 1 mm nonobstructing stone noted upper pole right kidney (best seen on coronal image 95/series 5). No right ureteral stone. No secondary changes in the  right kidney or ureter. Large staghorn calculus (approximately 3.3 x 3.0 x 3.6 cm) identified left renal pelvis with mild to moderate left hydronephrosis and perinephric and peripelvic edema/inflammation. Tiny gas bubbles are seen in an interpolar calyx and in the renal pelvis near the UPJ. Additional stones are seen scattered in the left kidney with an obstructing stone at the UPJ measuring 7 x 6 x 11 mm. Left ureter unremarkable. The urinary bladder appears normal for the degree of distention. Stomach/Bowel: Stomach is unremarkable. No gastric wall thickening. No evidence of outlet obstruction. Duodenum is normally positioned as is the ligament of Treitz. No small bowel wall thickening. No small bowel dilatation. The terminal ileum is normal. The appendix is normal. No gross colonic mass. No colonic wall thickening. Vascular/Lymphatic: No abdominal aortic aneurysm. There is no gastrohepatic or hepatoduodenal ligament lymphadenopathy. Borderline lymphadenopathy noted left para-aortic space with 11 mm short axis node visible on image 33/2. No pelvic sidewall lymphadenopathy. Reproductive: The uterus is unremarkable.  There is no adnexal mass. Other: No intraperitoneal free fluid. Musculoskeletal: Advanced degenerative changes noted right hip No worrisome lytic or sclerotic osseous abnormality. IMPRESSION: 1. 7 x 6 x 11 mm obstructing stone at the left UPJ. Moderate dilatation of the left intrarenal collecting system with tiny gas bubble seen in an interpolar calyx and in the low renal pelvis, features  compatible with pyonephrosis. 2. Very large staghorn calculus identified in the right renal pelvis extending into interpolar calices. Additional scattered stones noted in the left kidney. 3. Advanced degenerative changes right hip. 4. Emphysema (ICD10-J43.9). Electronically Signed   By: Kennith Center M.D.   On: 04/20/2020 13:51        Scheduled Meds: . pantoprazole  40 mg Oral Daily  . phosphorus  250 mg Oral TID  . scopolamine  1 patch Transdermal Once   Continuous Infusions: . cefTRIAXone (ROCEPHIN)  IV 200 mL/hr at 04/22/20 0302     LOS: 1 day    Time spent: 32 minutes    Dorcas Carrow, MD [facility] Pager [phone]

## 2020-04-22 NOTE — Progress Notes (Signed)
2 Days Post-Op Subjective: Denies pain, no nausea or emesis. Afebrile.  Objective: Vital signs in last 24 hours: Temp:  [98.4 F (36.9 C)-100 F (37.8 C)] 98.8 F (37.1 C) (04/10 1154) Pulse Rate:  [86-111] 86 (04/10 1154) Resp:  [16-18] 18 (04/10 0543) BP: (108-126)/(54-76) 122/63 (04/10 1154) SpO2:  [93 %-97 %] 97 % (04/10 1154)  Intake/Output from previous day: 04/09 0701 - 04/10 0700 In: 3373.4 [P.O.:360; I.V.:2774.5; IV Piggyback:238.8] Out: 3900 [Urine:3900] Intake/Output this shift: Total I/O In: 120 [P.O.:120] Out: -    UOP: 3.9L  Physical Exam:  General: Alert and oriented CV: RRR Lungs: Clear Abdomen: Soft, ND, NT Ext: NT, No erythema  Lab Results: Recent Labs    04/20/20 1116 04/21/20 0516 04/22/20 0443  HGB 13.5 11.5* 10.6*  HCT 40.6 34.0* 32.2*   BMET Recent Labs    04/21/20 0516 04/22/20 0443  NA 136 139  K 3.0* 4.0  CL 104 109  CO2 23 23  GLUCOSE 101* 102*  BUN 10 9  CREATININE 0.65 0.60  CALCIUM 8.5* 8.5*     Studies/Results: DG OR UROLOGY CYSTO IMAGE ([facility] ONLY)  Result Date: 04/20/2020 There is no interpretation for this exam.  This order is for images obtained during a surgical procedure.  Please See "Surgeries" Tab for more information regarding the procedure.    Assessment/Plan: 1. Obstructing left UPJ stone with sign of infection: S/p cystoscopy, left ureteral stent placement on 04/20/2020 by Dr. Alvester Morin. 2. Large staghorn calculus in the left renal pelvis 3. Sepsis due to obstructing stone: Resolving.  Presented febrile, leukocytosis of 36, urinalysis with positive nitrite, moderate leukocyte esterase. UCx 4/8 multiple species present. Blood cx 4/8 enterobacterales and klebsiella. 4. Bacteremia: Blood cx 4/8 with enterobacterales and klebsiella  -UCX 4/8 multiple species present -Blood cx 4/8 enterobacterales and klebsiella -Continue ceftriaxone. Will need at least 10 day course of abx.  -We will need definitive treatment of  stone as an outpatient.  This will require a left-sided PCNL.  Patient prefers to follow-up with Dr. Richardo Hanks as her husband already sees him. -I messaged schedulers to arrange follow-up. -Following peripherally.  Please call with questions.   LOS: 1 day   Matt R. Ivie Maese MD 04/22/2020, 1:43 PM [facility]  Pager: [phone]

## 2020-04-23 DIAGNOSIS — N130 Hydronephrosis with ureteropelvic junction obstruction: Secondary | ICD-10-CM

## 2020-04-23 LAB — CBC WITH DIFFERENTIAL/PLATELET
Abs Immature Granulocytes: 0.15 10*3/uL — ABNORMAL HIGH (ref 0.00–0.07)
Basophils Absolute: 0.1 10*3/uL (ref 0.0–0.1)
Basophils Relative: 0 %
Eosinophils Absolute: 0.3 10*3/uL (ref 0.0–0.5)
Eosinophils Relative: 2 %
HCT: 30.4 % — ABNORMAL LOW (ref 36.0–46.0)
Hemoglobin: 10.2 g/dL — ABNORMAL LOW (ref 12.0–15.0)
Immature Granulocytes: 1 %
Lymphocytes Relative: 8 %
Lymphs Abs: 1.6 10*3/uL (ref 0.7–4.0)
MCH: 31.6 pg (ref 26.0–34.0)
MCHC: 33.6 g/dL (ref 30.0–36.0)
MCV: 94.1 fL (ref 80.0–100.0)
Monocytes Absolute: 0.9 10*3/uL (ref 0.1–1.0)
Monocytes Relative: 4 %
Neutro Abs: 16.8 10*3/uL — ABNORMAL HIGH (ref 1.7–7.7)
Neutrophils Relative %: 85 %
Platelets: 384 10*3/uL (ref 150–400)
RBC: 3.23 MIL/uL — ABNORMAL LOW (ref 3.87–5.11)
RDW: 12.6 % (ref 11.5–15.5)
WBC: 19.8 10*3/uL — ABNORMAL HIGH (ref 4.0–10.5)
nRBC: 0 % (ref 0.0–0.2)

## 2020-04-23 MED ORDER — ACETAMINOPHEN 500 MG PO TABS
1000.0000 mg | ORAL_TABLET | Freq: Four times a day (QID) | ORAL | Status: DC | PRN
  Administered 2020-04-23 – 2020-04-24 (×3): 1000 mg via ORAL
  Filled 2020-04-23 (×2): qty 2

## 2020-04-23 MED ORDER — SODIUM CHLORIDE 0.9 % IV SOLN
INTRAVENOUS | Status: DC | PRN
  Administered 2020-04-23 (×2): 500 mL via INTRAVENOUS

## 2020-04-23 NOTE — Progress Notes (Signed)
Order received from Dr Jerral Ralph to renew tylenol

## 2020-04-23 NOTE — Progress Notes (Signed)
Urology Inpatient Progress Note  Subjective: No acute events overnight.  She is afebrile, VSS. WBC count down today, 19.8.  Blood culture with Klebsiella pneumoniae, urine culture with multiple species present.  On antibiotics as below. Today, patient reports feeling better.  She has some left flank soreness and urinary urgency but otherwise her pain is well tolerated.  She has requested to follow-up outpatient with Dr. Richardo Hanks, as her husband is already a patient of his.  Anti-infectives: Anti-infectives (From admission, onward)   Start     Dose/Rate Route Frequency Ordered Stop   04/22/20 0245  cefTRIAXone (ROCEPHIN) 2 g in sodium chloride 0.9 % 100 mL IVPB        2 g 200 mL/hr over 30 Minutes Intravenous Every 24 hours 04/22/20 0147     04/21/20 1200  cefTRIAXone (ROCEPHIN) 1 g in sodium chloride 0.9 % 100 mL IVPB  Status:  Discontinued        1 g 200 mL/hr over 30 Minutes Intravenous Every 24 hours 04/20/20 1543 04/22/20 0146   04/20/20 1315  cefTRIAXone (ROCEPHIN) 1 g in sodium chloride 0.9 % 100 mL IVPB        1 g 200 mL/hr over 30 Minutes Intravenous  Once 04/20/20 1308 04/20/20 1449      Current Facility-Administered Medications  Medication Dose Route Frequency Provider Last Rate Last Admin  . 0.9 %  sodium chloride infusion   Intravenous PRN Dorcas Carrow, MD 10 mL/hr at 04/23/20 0351 Infusion Verify at 04/23/20 0351  . acetaminophen (TYLENOL) tablet 1,000 mg  1,000 mg Oral Q6H PRN Cox, Amy N, DO   1,000 mg at 04/23/20 0825   Or  . acetaminophen (TYLENOL) suppository 650 mg  650 mg Rectal Q6H PRN Cox, Amy N, DO      . cefTRIAXone (ROCEPHIN) 2 g in sodium chloride 0.9 % 100 mL IVPB  2 g Intravenous Q24H Manuela Schwartz, NP 200 mL/hr at 04/23/20 0351 2 g at 04/23/20 0351  . ondansetron (ZOFRAN) tablet 4 mg  4 mg Oral Q6H PRN Cox, Amy N, DO   4 mg at 04/22/20 1851   Or  . ondansetron (ZOFRAN) injection 4 mg  4 mg Intravenous Q6H PRN Cox, Amy N, DO   4 mg at 04/21/20 0348  .  oxyCODONE (Oxy IR/ROXICODONE) immediate release tablet 5 mg  5 mg Oral Q4H PRN Dorcas Carrow, MD   5 mg at 04/23/20 0826  . pantoprazole (PROTONIX) EC tablet 40 mg  40 mg Oral Daily Manuela Schwartz, NP   40 mg at 04/23/20 0825  . phosphorus (K PHOS NEUTRAL) tablet 250 mg  250 mg Oral TID Dorcas Carrow, MD   250 mg at 04/23/20 0826  . scopolamine (TRANSDERM-SCOP) 1 MG/3DAYS 1.5 mg  1 patch Transdermal Once Naomie Dean, MD   1.5 mg at 04/20/20 1853   Objective: Vital signs in last 24 hours: Temp:  [97.8 F (36.6 C)-98.8 F (37.1 C)] 97.8 F (36.6 C) (04/11 1115) Pulse Rate:  [73-86] 80 (04/11 1115) Resp:  [15-18] 15 (04/11 1115) BP: (117-136)/(59-86) 122/76 (04/11 1115) SpO2:  [94 %-100 %] 96 % (04/11 1115)  Intake/Output from previous day: 04/10 0701 - 04/11 0700 In: 131.4 [P.O.:120; I.V.:0.3; IV Piggyback:11.1] Out: 600 [Urine:600] Intake/Output this shift: Total I/O In: 240 [P.O.:240] Out: -   Physical Exam Vitals and nursing note reviewed.  Constitutional:      General: She is not in acute distress.    Appearance: She is not ill-appearing, toxic-appearing  or diaphoretic.  HENT:     Head: Normocephalic and atraumatic.  Pulmonary:     Effort: Pulmonary effort is normal. No respiratory distress.  Skin:    General: Skin is warm and dry.  Neurological:     Mental Status: She is alert and oriented to person, place, and time.  Psychiatric:        Mood and Affect: Mood normal.        Behavior: Behavior normal.    Lab Results:  Recent Labs    04/22/20 0443 04/23/20 0429  WBC 28.2* 19.8*  HGB 10.6* 10.2*  HCT 32.2* 30.4*  PLT 365 384   BMET Recent Labs    04/21/20 0516 04/22/20 0443  NA 136 139  K 3.0* 4.0  CL 104 109  CO2 23 23  GLUCOSE 101* 102*  BUN 10 9  CREATININE 0.65 0.60  CALCIUM 8.5* 8.5*   Assessment & Plan: 55 year old female s/p left ureteral stent placement for management of an 11 mm obstructing left UPJ stone with infection and  Klebsiella bacteremia, also with a large left staghorn calculus.  Patient clinically improving on empiric antibiotics.  Recommend a total of 10 to 14 days of empiric therapy given findings of multiple species on urine culture.  Recommend starting Flomax and oxybutynin for management of mild stent discomfort.  We discussed that she will require outpatient follow-up with Dr. Richardo Hanks next week to discuss definitive stone management, likely PCNL for management of her left staghorn and obstructing UPJ stones.  She expressed understanding  Carman Ching, PA-C 04/23/2020

## 2020-04-23 NOTE — Progress Notes (Signed)
PROGRESS NOTE    Laura Adams  ONG:[id number] DOB: 05/01/1965 DOA: 04/20/2020 PCP: Dortha KernBliss, Laura K, MD    Brief Narrative:  55 year old female with history of multiple nephrolithiasis, left-sided staghorn calculi presented to the ER with severe back pain and fever.  In the emergency room temperature 97.7.  Hemodynamically stable.  Urinalysis with leukocytes and positive for nitrates.  CT scan revealed staghorn calculi 3.3 x 3.6 cm in the left renal pelvis, mild to moderate left hydronephrosis and perinephric inflammation.  Obstructive stone UPJ.  Resuscitated and taken to the operating room for cystoscopy and left ureteroscopy. Urine culture with mixed flora.  Blood culture with Klebsiella pneumonia.  Final culture pending.   Assessment & Plan:   Active Problems:   Hydronephrosis with obstructing calculus   Nephrolithiasis   History of hypertension   Leukocytosis   Sepsis (HCC)   Staghorn calculus   Renal stone  Gram-negative bacteremia, sepsis present on admission .infected renal stone/infected staghorn calculi/obstructive infected left UPJ stone: Stabilizing. Status post cystoscopy, left ureteroscopy by urology 4/8. Continue Rocephin high doses for positive blood cultures. Urine culture with mixed flora, blood culture with Klebsiella.  Will treat with 10 days of therapy for complicated UTI.   Waiting for final sensitivity results, can potentially go home if sensitivity available on oral antibiotics.  Essential hypertension: Not on treatment.  Blood pressure is stable.  Hypokalemia/hypomagnesemia: Replaced and normalized.  DVT prophylaxis: Place TED hose Start: 04/20/20 1454   Code Status: Full code Family Communication: None, patient is communicating. Disposition Plan: Status is: Inpatient.   Dispo: The patient is from: Home              Anticipated d/c is to: Home              Patient currently is not medically stable to d/c.  Waiting for final sensitivity results on  blood cultures.   Difficult to place patient No   consultants:   Urology  Procedures:   Cystoscopy and ureteroscopy 4/8, Dr. Alvester MorinBell  Antimicrobials:   Rocephin, 4/8--   Subjective: Patient seen and examined.  Remains afebrile overnight.  Flank pain is slightly better today.  Up about and moving.  Denies any nausea vomiting.  Objective: Vitals:   04/22/20 2307 04/23/20 0457 04/23/20 0827 04/23/20 1115  BP: 129/76 117/75 135/86 122/76  Pulse: 79 73 76 80  Resp: 18 18 16 15   Temp: 98.2 F (36.8 C) 98.1 F (36.7 C) 98.7 F (37.1 C) 97.8 F (36.6 C)  TempSrc:   Oral Oral  SpO2: 95% 95% 100% 96%  Weight:      Height:        Intake/Output Summary (Last 24 hours) at 04/23/2020 1140 Last data filed at 04/23/2020 0958 Gross per 24 hour  Intake 251.36 ml  Output 600 ml  Net -348.64 ml   Filed Weights   04/20/20 1241  Weight: 72.6 kg    Examination:  General exam: Appears calm and comfortable  Respiratory system: Clear to auscultation. Respiratory effort normal. Cardiovascular system: S1 & S2 heard, RRR. No JVD, murmurs, rubs, gallops or clicks. No pedal edema. Gastrointestinal system: Patient does have mild tenderness along the left flank.  No rebound or rigidity.  Bowel sounds present. Central nervous system: Alert and oriented. No focal neurological deficits. Extremities: Symmetric 5 x 5 power. Skin: No rashes, lesions or ulcers Psychiatry: Judgement and insight appear normal. Mood & affect appropriate.     Data Reviewed: I have personally reviewed following  labs and imaging studies  CBC: Recent Labs  Lab 04/20/20 1116 04/21/20 0516 04/22/20 0443 04/23/20 0429  WBC 35.9* 36.6* 28.2* 19.8*  NEUTROABS 33.5*  --  25.0* 16.8*  HGB 13.5 11.5* 10.6* 10.2*  HCT 40.6 34.0* 32.2* 30.4*  MCV 93.5 92.1 94.2 94.1  PLT 565* 394 365 384   Basic Metabolic Panel: Recent Labs  Lab 04/20/20 1116 04/21/20 0516 04/22/20 0443  NA 137 136 139  K 3.2* 3.0* 4.0  CL  102 104 109  CO2 23 23 23   GLUCOSE 112* 101* 102*  BUN 19 10 9   CREATININE 0.87 0.65 0.60  CALCIUM 9.6 8.5* 8.5*  MG  --  1.4* 1.9  PHOS  --   --  2.2*   GFR: Estimated Creatinine Clearance: 77.3 mL/min (by C-G formula based on SCr of 0.6 mg/dL). Liver Function Tests: Recent Labs  Lab 04/20/20 1116  AST 28  ALT 31  ALKPHOS 87  BILITOT 0.6  PROT 8.0  ALBUMIN 4.0   No results for input(s): LIPASE, AMYLASE in the last 168 hours. No results for input(s): AMMONIA in the last 168 hours. Coagulation Profile: No results for input(s): INR, PROTIME in the last 168 hours. Cardiac Enzymes: No results for input(s): CKTOTAL, CKMB, CKMBINDEX, TROPONINI in the last 168 hours. BNP (last 3 results) No results for input(s): PROBNP in the last 8760 hours. HbA1C: No results for input(s): HGBA1C in the last 72 hours. CBG: No results for input(s): GLUCAP in the last 168 hours. Lipid Profile: No results for input(s): CHOL, HDL, LDLCALC, TRIG, CHOLHDL, LDLDIRECT in the last 72 hours. Thyroid Function Tests: No results for input(s): TSH, T4TOTAL, FREET4, T3FREE, THYROIDAB in the last 72 hours. Anemia Panel: No results for input(s): VITAMINB12, FOLATE, FERRITIN, TIBC, IRON, RETICCTPCT in the last 72 hours. Sepsis Labs: Recent Labs  Lab 04/21/20 0516 04/21/20 0716  LATICACIDVEN 1.1 1.3    Recent Results (from the past 240 hour(s))  Urine Culture     Status: Abnormal   Collection Time: 04/20/20 11:16 AM   Specimen: Urine, Random  Result Value Ref Range Status   Specimen Description   Final    URINE, RANDOM Performed at [facility], [address], [place], [place]    Special Requests   Final    NONE Performed at [facility], [address] [place], [address] [place]    Culture MULTIPLE SPECIES PRESENT, SUGGEST RECOLLECTION (A)  Final   Report Status 04/22/2020 FINAL  Final  Resp Panel by RT-PCR (Flu A&B, Covid) Nasopharyngeal Swab     Status: None    Collection Time: 04/20/20 12:36 PM   Specimen: Nasopharyngeal Swab; Nasopharyngeal(NP) swabs in vial transport medium  Result Value Ref Range Status   SARS Coronavirus 2 by RT PCR NEGATIVE NEGATIVE Final    Comment: (NOTE) SARS-CoV-2 target nucleic acids are NOT DETECTED.  The SARS-CoV-2 RNA is generally detectable in upper respiratory specimens during the acute phase of infection. The lowest concentration of SARS-CoV-2 viral copies this assay can detect is 138 copies/mL. A negative result does not preclude SARS-Cov-2 infection and should not be used as the sole basis for treatment or other patient management decisions. A negative result may occur with  improper specimen collection/handling, submission of specimen other than nasopharyngeal swab, presence of viral mutation(s) within the areas targeted by this assay, and inadequate number of viral copies(<138 copies/mL). A negative result must be combined with clinical observations, patient history, and epidemiological information. The expected  result is Negative.  Fact Sheet for Patients:  BloggerCourse.com  Fact Sheet for Healthcare Providers:  SeriousBroker.it  This test is no t yet approved or cleared by the [place] FDA and  has been authorized for detection and/or diagnosis of SARS-CoV-2 by FDA under an Emergency Use Authorization (EUA). This EUA will remain  in effect (meaning this test can be used) for the duration of the COVID-19 declaration under Section 564(b)(1) of the Act, 21 U.S.C.section 360bbb-3(b)(1), unless the authorization is terminated  or revoked sooner.       Influenza A by PCR NEGATIVE NEGATIVE Final   Influenza B by PCR NEGATIVE NEGATIVE Final    Comment: (NOTE) The Xpert Xpress SARS-CoV-2/FLU/RSV plus assay is intended as an aid in the diagnosis of influenza from Nasopharyngeal swab specimens and should not be used as a sole basis for treatment.  Nasal washings and aspirates are unacceptable for Xpert Xpress SARS-CoV-2/FLU/RSV testing.  Fact Sheet for Patients: BloggerCourse.com  Fact Sheet for Healthcare Providers: SeriousBroker.it  This test is not yet approved or cleared by the [place] FDA and has been authorized for detection and/or diagnosis of SARS-CoV-2 by FDA under an Emergency Use Authorization (EUA). This EUA will remain in effect (meaning this test can be used) for the duration of the COVID-19 declaration under Section 564(b)(1) of the Act, 21 U.S.C. section 360bbb-3(b)(1), unless the authorization is terminated or revoked.  Performed at [facility], [address] [place], [place] [address]   Culture, blood (routine x 2)     Status: Abnormal (Preliminary result)   Collection Time: 04/20/20  9:15 PM   Specimen: BLOOD  Result Value Ref Range Status   Specimen Description   Final    BLOOD BLOOD LEFT HAND Performed at [facility], [address] [place], [place] [address]    Special Requests   Final    BOTTLES DRAWN AEROBIC AND ANAEROBIC Blood Culture results may not be optimal due to an excessive volume of blood received in culture bottles Performed at [facility], [address] [place], [place] [address]    Culture  Setup Time   Final    Organism ID to follow GRAM NEGATIVE RODS ANAEROBIC BOTTLE ONLY CRITICAL RESULT CALLED TO, READ BACK BY AND VERIFIED WITH: NATHAN BELUE AT 0132 04/22/20. MF Performed at [facility], [address], [place], [place] [address]    Culture (A)  Final    KLEBSIELLA PNEUMONIAE SUSCEPTIBILITIES TO FOLLOW Performed at [facility], [address], [place], [place] [address]    Report Status PENDING  Incomplete  Culture, blood (routine x 2)     Status: None (Preliminary result)   Collection Time: 04/20/20  9:15 PM   Specimen: BLOOD  Result Value Ref Range Status    Specimen Description BLOOD BLOOD RIGHT HAND  Final   Special Requests   Final    BOTTLES DRAWN AEROBIC AND ANAEROBIC Blood Culture results may not be optimal due to an excessive volume of blood received in culture bottles   Culture   Final    NO GROWTH 3 DAYS Performed at [facility], [address] [place], [place] [address]    Report Status PENDING  Incomplete  Blood Culture ID Panel (Reflexed)     Status: Abnormal   Collection Time: 04/20/20  9:15 PM  Result Value Ref Range Status   Enterococcus faecalis NOT DETECTED NOT DETECTED Final   Enterococcus Faecium NOT DETECTED NOT DETECTED Final  Listeria monocytogenes NOT DETECTED NOT DETECTED Final   Staphylococcus species NOT DETECTED NOT DETECTED Final   Staphylococcus aureus (BCID) NOT DETECTED NOT DETECTED Final   Staphylococcus epidermidis NOT DETECTED NOT DETECTED Final   Staphylococcus lugdunensis NOT DETECTED NOT DETECTED Final   Streptococcus species NOT DETECTED NOT DETECTED Final   Streptococcus agalactiae NOT DETECTED NOT DETECTED Final   Streptococcus pneumoniae NOT DETECTED NOT DETECTED Final   Streptococcus pyogenes NOT DETECTED NOT DETECTED Final   A.calcoaceticus-baumannii NOT DETECTED NOT DETECTED Final   Bacteroides fragilis NOT DETECTED NOT DETECTED Final   Enterobacterales DETECTED (A) NOT DETECTED Final    Comment: Enterobacterales represent a large order of gram negative bacteria, not a single organism. CRITICAL RESULT CALLED TO, READ BACK BY AND VERIFIED WITH: NATHAN BELUE AT 0132 04/22/20 MF.    Enterobacter cloacae complex NOT DETECTED NOT DETECTED Final   Escherichia coli NOT DETECTED NOT DETECTED Final   Klebsiella aerogenes NOT DETECTED NOT DETECTED Final   Klebsiella oxytoca NOT DETECTED NOT DETECTED Final   Klebsiella pneumoniae DETECTED (A) NOT DETECTED Final    Comment: CRITICAL RESULT CALLED TO, READ BACK BY AND VERIFIED WITH: NATHAN BELUE AT 0132 04/22/20 MF.    Proteus species NOT  DETECTED NOT DETECTED Final   Salmonella species NOT DETECTED NOT DETECTED Final   Serratia marcescens NOT DETECTED NOT DETECTED Final   Haemophilus influenzae NOT DETECTED NOT DETECTED Final   Neisseria meningitidis NOT DETECTED NOT DETECTED Final   Pseudomonas aeruginosa NOT DETECTED NOT DETECTED Final   Stenotrophomonas maltophilia NOT DETECTED NOT DETECTED Final   Candida albicans NOT DETECTED NOT DETECTED Final   Candida auris NOT DETECTED NOT DETECTED Final   Candida glabrata NOT DETECTED NOT DETECTED Final   Candida krusei NOT DETECTED NOT DETECTED Final   Candida parapsilosis NOT DETECTED NOT DETECTED Final   Candida tropicalis NOT DETECTED NOT DETECTED Final   Cryptococcus neoformans/gattii NOT DETECTED NOT DETECTED Final   CTX-M ESBL NOT DETECTED NOT DETECTED Final   Carbapenem resistance IMP NOT DETECTED NOT DETECTED Final   Carbapenem resistance KPC NOT DETECTED NOT DETECTED Final   Carbapenem resistance NDM NOT DETECTED NOT DETECTED Final   Carbapenem resist OXA 48 LIKE NOT DETECTED NOT DETECTED Final   Carbapenem resistance VIM NOT DETECTED NOT DETECTED Final    Comment: Performed at [facility], [address] [place], [place] [address]         Radiology Studies: No results found.      Scheduled Meds: . pantoprazole  40 mg Oral Daily  . phosphorus  250 mg Oral TID  . scopolamine  1 patch Transdermal Once   Continuous Infusions: . sodium chloride 10 mL/hr at 04/23/20 0351  . cefTRIAXone (ROCEPHIN)  IV 2 g (04/23/20 0351)     LOS: 2 days    Time spent: 32 minutes    Dorcas Carrow, MD [facility] Pager [phone]

## 2020-04-24 DIAGNOSIS — Z8679 Personal history of other diseases of the circulatory system: Secondary | ICD-10-CM

## 2020-04-24 DIAGNOSIS — N200 Calculus of kidney: Secondary | ICD-10-CM

## 2020-04-24 DIAGNOSIS — D72829 Elevated white blood cell count, unspecified: Secondary | ICD-10-CM

## 2020-04-24 LAB — CULTURE, BLOOD (ROUTINE X 2)

## 2020-04-24 MED ORDER — K PHOS MONO-SOD PHOS DI & MONO 155-852-130 MG PO TABS: 250.0000 mg | ORAL_TABLET | Freq: Three times a day (TID) | ORAL | 0 refills | Status: AC

## 2020-04-24 MED ORDER — CIPROFLOXACIN HCL 500 MG PO TABS: 500.0000 mg | ORAL_TABLET | Freq: Two times a day (BID) | ORAL | 0 refills | Status: AC

## 2020-04-24 MED ORDER — CEFAZOLIN SODIUM-DEXTROSE 2-4 GM/100ML-% IV SOLN
2.0000 g | Freq: Three times a day (TID) | INTRAVENOUS | Status: DC
  Filled 2020-04-24: qty 100

## 2020-04-24 MED ORDER — OXYCODONE HCL 5 MG PO TABS: 5.0000 mg | ORAL_TABLET | ORAL | 0 refills | Status: AC | PRN

## 2020-04-24 NOTE — Discharge Summary (Signed)
Laura Adams ZOX:[id number] DOB: [birth year] DOA: 04/20/2020  PCP: Dortha Kern, MD  Admit date: 04/20/2020 Discharge date: 04/24/2020  Admitted From: Home Disposition: Home  Recommendations for Outpatient Follow-up:  1. Follow up with PCP in 1 week 2. Please obtain BMP/CBC in one week 3. Urology in 1 week     Discharge Condition:Stable CODE STATUS: Full Diet recommendation: Heart Healthy / Carb Modified   Brief/Interim Summary: Per Laura Adams is a 55 y.o. female with medical history significant for multiple nephrolithiasis, left-sided staghorn calculi, presents emergency department for chief concerns of back pain.  UA revealed moderate leukocytes and positive for nitrites.  CT renal revealed staghorn calculi approximately 3.3 x 3 x 3.6 cm in the left renal pelvis with mild to moderate left hydronephrosis and perinephritic and peripelvic edema and inflammation.  Tiny gas bubbles seen in the interpolar calyx and in the renal pelvis near the UPJ.  Obstructing stone at the UPJ measuring 7 x 6 x 11 mm.  Additional stones are scattered in the left kidney.  Left ureter unremarkable.  Urinary bladder appears normal for the degree of distention.  No right ureteral stone.  1 mm nonobstructing stone noted in the upper pole right kidney.  Urology was consulted.   Obstructing left UPJ stone with sign of infection: S/p cystoscopy, left ureteral stent placement on 04/20/2020 by Dr. Alvester Morin. Started on IV antibiotics.  Will need to follow-up with urology next week.  Urine culture with multiple species present. Urology recommended 10 to 14 days of treatment with antibiotics.      Klebsiella pneumonia bacteremia/sepsis- bcx + for Klebsiella.  Likely 2/2 above We will discharge with p.o. medication to cover the course Will need to follow-up with PCP get repeat cultures after treatment   Essential hypertension: Continue outpatient medication  Hypokalemia/hypomagnesemia: Replaced and  normalized  Hypophosphatemia-replace  Discharge Diagnoses:  Active Problems:   Hydronephrosis with obstructing calculus   Nephrolithiasis   History of hypertension   Leukocytosis   Sepsis (HCC)   Staghorn calculus   Renal stone    Discharge Instructions  Discharge Instructions    Call MD for:  temperature >100.4   Complete by: As directed    Diet - low sodium heart healthy   Complete by: As directed    Discharge instructions   Complete by: As directed    Follow up with urology and pcp next week   Increase activity slowly   Complete by: As directed    No wound care   Complete by: As directed      Allergies as of 04/24/2020   No Known Allergies     Medication List    STOP taking these medications   amoxicillin-clavulanate 875-125 MG tablet Commonly known as: AUGMENTIN   hydrochlorothiazide 12.5 MG capsule Commonly known as: MICROZIDE     TAKE these medications   ciprofloxacin 500 MG tablet Commonly known as: CIPRO Take 1 tablet (500 mg total) by mouth 2 (two) times daily for 10 days.   gemfibrozil 600 MG tablet Commonly known as: LOPID Take 600 mg by mouth 2 (two) times daily.   niacin 500 MG CR tablet Commonly known as: NIASPAN Take 500 mg by mouth daily.   Nortrel 7/7/7 0.5/0.75/1-35 MG-MCG tablet Generic drug: norethindrone-ethinyl estradiol Take by mouth.   omeprazole 20 MG capsule Commonly known as: PRILOSEC Take 1 capsule by mouth daily.   oxyCODONE 5 MG immediate release tablet Commonly known as: Oxy IR/ROXICODONE Take 1 tablet (5 mg total)  by mouth every 4 (four) hours as needed for up to 3 days for moderate pain or breakthrough pain.   phosphorus 155-852-130 MG tablet Commonly known as: K PHOS NEUTRAL Take 1 tablet (250 mg total) by mouth 3 (three) times daily for 2 days.   promethazine 25 MG tablet Commonly known as: PHENERGAN Take 25 mg by mouth every 6 (six) hours as needed.   SUMAtriptan 100 MG tablet Commonly known as:  IMITREX Take by mouth.       Follow-up Information    Sondra Come, MD Follow up in 1 week(s).   Specialty: Urology Contact information: [address] [place] [address] [phone]        Dortha Kern, MD Follow up in 1 week(s).   Specialty: Family Medicine Why: need repeat bcx Contact information: [address] [place] [address] [phone]              No Known Allergies  Consultations:   Urology  Procedures/Studies: DG Abdomen 1 View  Result Date: 04/20/2020 CLINICAL DATA:  Left lower quadrant abdominal pain. EXAM: ABDOMEN - 1 VIEW COMPARISON:  None. FINDINGS: The bowel gas pattern is normal. Large staghorn type left renal calculus is noted. IMPRESSION: No evidence of bowel obstruction or ileus. Large left renal calculus is noted. Electronically Signed   By: Lupita Raider M.D.   On: 04/20/2020 11:29   DG OR UROLOGY CYSTO IMAGE ([facility] ONLY)  Result Date: 04/20/2020 There is no interpretation for this exam.  This order is for images obtained during a surgical procedure.  Please See "Surgeries" Tab for more information regarding the procedure.   CT Renal Stone Study  Result Date: 04/20/2020 CLINICAL DATA:  Low back pain radiates abdomen. Nausea vomiting. History of kidney stones. EXAM: CT ABDOMEN AND PELVIS WITHOUT CONTRAST TECHNIQUE: Multidetector CT imaging of the abdomen and pelvis was performed following the standard protocol without IV contrast. COMPARISON:  None. FINDINGS: Lower chest: Unremarkable. Hepatobiliary: No focal abnormality in the liver on this study without intravenous contrast. There is no evidence for gallstones, gallbladder wall thickening, or pericholecystic fluid. No intrahepatic or extrahepatic biliary dilation. Pancreas: No focal mass lesion. No dilatation of the main duct. No intraparenchymal cyst. No peripancreatic edema. Spleen: No splenomegaly. No focal mass lesion. Adrenals/Urinary Tract: No adrenal nodule or mass. 1 mm  nonobstructing stone noted upper pole right kidney (best seen on coronal image 95/series 5). No right ureteral stone. No secondary changes in the right kidney or ureter. Large staghorn calculus (approximately 3.3 x 3.0 x 3.6 cm) identified left renal pelvis with mild to moderate left hydronephrosis and perinephric and peripelvic edema/inflammation. Tiny gas bubbles are seen in an interpolar calyx and in the renal pelvis near the UPJ. Additional stones are seen scattered in the left kidney with an obstructing stone at the UPJ measuring 7 x 6 x 11 mm. Left ureter unremarkable. The urinary bladder appears normal for the degree of distention. Stomach/Bowel: Stomach is unremarkable. No gastric wall thickening. No evidence of outlet obstruction. Duodenum is normally positioned as is the ligament of Treitz. No small bowel wall thickening. No small bowel dilatation. The terminal ileum is normal. The appendix is normal. No gross colonic mass. No colonic wall thickening. Vascular/Lymphatic: No abdominal aortic aneurysm. There is no gastrohepatic or hepatoduodenal ligament lymphadenopathy. Borderline lymphadenopathy noted left para-aortic space with 11 mm short axis node visible on image 33/2. No pelvic sidewall lymphadenopathy. Reproductive: The uterus is unremarkable.  There is no adnexal  mass. Other: No intraperitoneal free fluid. Musculoskeletal: Advanced degenerative changes noted right hip No worrisome lytic or sclerotic osseous abnormality. IMPRESSION: 1. 7 x 6 x 11 mm obstructing stone at the left UPJ. Moderate dilatation of the left intrarenal collecting system with tiny gas bubble seen in an interpolar calyx and in the low renal pelvis, features compatible with pyonephrosis. 2. Very large staghorn calculus identified in the right renal pelvis extending into interpolar calices. Additional scattered stones noted in the left kidney. 3. Advanced degenerative changes right hip. 4. Emphysema (ICD10-J43.9). Electronically  Signed   By: Kennith Center M.D.   On: 04/20/2020 13:51      Subjective: Feels better today.  Mild dysuria.  No other comments   Discharge Exam: Vitals:   04/24/20 0805 04/24/20 1141  BP: (!) 149/77 132/68  Pulse: 77 83  Resp: 16   Temp: 98.8 F (37.1 C) 98.2 F (36.8 C)  SpO2: 97% 95%   Vitals:   04/23/20 2358 04/24/20 0349 04/24/20 0805 04/24/20 1141  BP: 130/75 137/83 (!) 149/77 132/68  Pulse: 93 85 77 83  Resp: Temp: 99.5 F (37.5 C) 98 F (36.7 C) 98.8 F (37.1 C) 98.2 F (36.8 C)  TempSrc:  Oral    SpO2: 98% 99% 97% 95%  Weight:      Height:        General: Pt is alert, awake, not in acute distress Cardiovascular: RRR, S1/S2 +, no rubs, no gallops Respiratory: CTA bilaterally, no wheezing, no rhonchi Abdominal: Soft, NT, ND, bowel sounds + Extremities: no edema, no cyanosis    The results of significant diagnostics from this hospitalization (including imaging, microbiology, ancillary and laboratory) are listed below for reference.     Microbiology: Recent Results (from the past 240 hour(s))  Urine Culture     Status: Abnormal   Collection Time: 04/20/20 11:16 AM   Specimen: Urine, Random  Result Value Ref Range Status   Specimen Description   Final    URINE, RANDOM Performed at [facility], [address], [place], [place] [address]    Special Requests   Final    NONE Performed at [facility], [address] [place], [place] [address]    Culture MULTIPLE SPECIES PRESENT, SUGGEST RECOLLECTION (A)  Final   Report Status 04/22/2020 FINAL  Final  Resp Panel by RT-PCR (Flu A&B, Covid) Nasopharyngeal Swab     Status: None   Collection Time: 04/20/20 12:36 PM   Specimen: Nasopharyngeal Swab; Nasopharyngeal(NP) swabs in vial transport medium  Result Value Ref Range Status   SARS Coronavirus 2 by RT PCR NEGATIVE NEGATIVE Final    Comment: (NOTE) SARS-CoV-2 target nucleic acids are NOT DETECTED.  The SARS-CoV-2 RNA  is generally detectable in upper respiratory specimens during the acute phase of infection. The lowest concentration of SARS-CoV-2 viral copies this assay can detect is 138 copies/mL. A negative result does not preclude SARS-Cov-2 infection and should not be used as the sole basis for treatment or other patient management decisions. A negative result may occur with  improper specimen collection/handling, submission of specimen other than nasopharyngeal swab, presence of viral mutation(s) within the areas targeted by this assay, and inadequate number of viral copies(<138 copies/mL). A negative result must be combined with clinical observations, patient history, and epidemiological information. The expected result is Negative.  Fact Sheet for Patients:  BloggerCourse.com  Fact Sheet for Healthcare Providers:  SeriousBroker.it  This test is no t yet  approved or cleared by the [place] and  has been authorized for detection and/or diagnosis of SARS-CoV-2 by FDA under an Emergency Use Authorization (EUA). This EUA will remain  in effect (meaning this test can be used) for the duration of the COVID-19 declaration under Section 564(b)(1) of the Act, 21 U.S.C.section 360bbb-3(b)(1), unless the authorization is terminated  or revoked sooner.       Influenza A by PCR NEGATIVE NEGATIVE Final   Influenza B by PCR NEGATIVE NEGATIVE Final    Comment: (NOTE) The Xpert Xpress SARS-CoV-2/FLU/RSV plus assay is intended as an aid in the diagnosis of influenza from Nasopharyngeal swab specimens and should not be used as a sole basis for treatment. Nasal washings and aspirates are unacceptable for Xpert Xpress SARS-CoV-2/FLU/RSV testing.  Fact Sheet for Patients: BloggerCourse.com  Fact Sheet for Healthcare Providers: SeriousBroker.it  This test is not yet approved or cleared by the  [place] FDA and has been authorized for detection and/or diagnosis of SARS-CoV-2 by FDA under an Emergency Use Authorization (EUA). This EUA will remain in effect (meaning this test can be used) for the duration of the COVID-19 declaration under Section 564(b)(1) of the Act, 21 U.S.C. section 360bbb-3(b)(1), unless the authorization is terminated or revoked.  Performed at [facility], [address] [place], [place] [address]   Culture, blood (routine x 2)     Status: Abnormal   Collection Time: 04/20/20  9:15 PM   Specimen: BLOOD  Result Value Ref Range Status   Specimen Description   Final    BLOOD BLOOD LEFT HAND Performed at [facility], [address] [place], [place] [address]    Special Requests   Final    BOTTLES DRAWN AEROBIC AND ANAEROBIC Blood Culture results may not be optimal due to an excessive volume of blood received in culture bottles Performed at [facility], [address] [place], [place] [address]    Culture  Setup Time   Final    Organism ID to follow GRAM NEGATIVE RODS ANAEROBIC BOTTLE ONLY CRITICAL RESULT CALLED TO, READ BACK BY AND VERIFIED WITH: NATHAN BELUE AT 0132 04/22/20. MF Performed at [facility], [address] [place], [place] [address]    Culture KLEBSIELLA PNEUMONIAE (A)  Final   Report Status 04/24/2020 FINAL  Final   Organism ID, Bacteria KLEBSIELLA PNEUMONIAE  Final      Susceptibility   Klebsiella pneumoniae - MIC*    AMPICILLIN RESISTANT Resistant     CEFAZOLIN <=4 SENSITIVE Sensitive     CEFEPIME <=0.12 SENSITIVE Sensitive     CEFTAZIDIME <=1 SENSITIVE Sensitive     CEFTRIAXONE <=0.25 SENSITIVE Sensitive     CIPROFLOXACIN <=0.25 SENSITIVE Sensitive     GENTAMICIN <=1 SENSITIVE Sensitive     IMIPENEM 1 SENSITIVE Sensitive     TRIMETH/SULFA <=20 SENSITIVE Sensitive     AMPICILLIN/SULBACTAM 4 SENSITIVE Sensitive     PIP/TAZO <=4 SENSITIVE Sensitive     * KLEBSIELLA PNEUMONIAE   Culture, blood (routine x 2)     Status: None (Preliminary result)   Collection Time: 04/20/20  9:15 PM   Specimen: BLOOD  Result Value Ref Range Status   Specimen Description BLOOD BLOOD RIGHT HAND  Final   Special Requests   Final    BOTTLES DRAWN AEROBIC AND ANAEROBIC Blood Culture results may not be optimal due to an excessive volume of blood received in culture bottles   Culture   Final    NO  GROWTH 4 DAYS Performed at [facility], [address] [place], [place] [address]    Report Status PENDING  Incomplete  Blood Culture ID Panel (Reflexed)     Status: Abnormal   Collection Time: 04/20/20  9:15 PM  Result Value Ref Range Status   Enterococcus faecalis NOT DETECTED NOT DETECTED Final   Enterococcus Faecium NOT DETECTED NOT DETECTED Final   Listeria monocytogenes NOT DETECTED NOT DETECTED Final   Staphylococcus species NOT DETECTED NOT DETECTED Final   Staphylococcus aureus (BCID) NOT DETECTED NOT DETECTED Final   Staphylococcus epidermidis NOT DETECTED NOT DETECTED Final   Staphylococcus lugdunensis NOT DETECTED NOT DETECTED Final   Streptococcus species NOT DETECTED NOT DETECTED Final   Streptococcus agalactiae NOT DETECTED NOT DETECTED Final   Streptococcus pneumoniae NOT DETECTED NOT DETECTED Final   Streptococcus pyogenes NOT DETECTED NOT DETECTED Final   A.calcoaceticus-baumannii NOT DETECTED NOT DETECTED Final   Bacteroides fragilis NOT DETECTED NOT DETECTED Final   Enterobacterales DETECTED (A) NOT DETECTED Final    Comment: Enterobacterales represent a large order of gram negative bacteria, not a single organism. CRITICAL RESULT CALLED TO, READ BACK BY AND VERIFIED WITH: NATHAN BELUE AT 0132 04/22/20 MF.    Enterobacter cloacae complex NOT DETECTED NOT DETECTED Final   Escherichia coli NOT DETECTED NOT DETECTED Final   Klebsiella aerogenes NOT DETECTED NOT DETECTED Final   Klebsiella oxytoca NOT DETECTED NOT DETECTED Final   Klebsiella pneumoniae  DETECTED (A) NOT DETECTED Final    Comment: CRITICAL RESULT CALLED TO, READ BACK BY AND VERIFIED WITH: NATHAN BELUE AT 0132 04/22/20 MF.    Proteus species NOT DETECTED NOT DETECTED Final   Salmonella species NOT DETECTED NOT DETECTED Final   Serratia marcescens NOT DETECTED NOT DETECTED Final   Haemophilus influenzae NOT DETECTED NOT DETECTED Final   Neisseria meningitidis NOT DETECTED NOT DETECTED Final   Pseudomonas aeruginosa NOT DETECTED NOT DETECTED Final   Stenotrophomonas maltophilia NOT DETECTED NOT DETECTED Final   Candida albicans NOT DETECTED NOT DETECTED Final   Candida auris NOT DETECTED NOT DETECTED Final   Candida glabrata NOT DETECTED NOT DETECTED Final   Candida krusei NOT DETECTED NOT DETECTED Final   Candida parapsilosis NOT DETECTED NOT DETECTED Final   Candida tropicalis NOT DETECTED NOT DETECTED Final   Cryptococcus neoformans/gattii NOT DETECTED NOT DETECTED Final   CTX-M ESBL NOT DETECTED NOT DETECTED Final   Carbapenem resistance IMP NOT DETECTED NOT DETECTED Final   Carbapenem resistance KPC NOT DETECTED NOT DETECTED Final   Carbapenem resistance NDM NOT DETECTED NOT DETECTED Final   Carbapenem resist OXA 48 LIKE NOT DETECTED NOT DETECTED Final   Carbapenem resistance VIM NOT DETECTED NOT DETECTED Final    Comment: Performed at [facility], [address] [place], [place] [address]     Labs: BNP (last 3 results) No results for input(s): BNP in the last 8760 hours. Basic Metabolic Panel: Recent Labs  Lab 04/20/20 1116 04/21/20 0516 04/22/20 0443  NA 137 136 139  K 3.2* 3.0* 4.0  CL 102 104 109  CO2 GLUCOSE 112* 101* 102*  BUN CREATININE 0.87 0.65 0.60  CALCIUM 9.6 8.5* 8.5*  MG  --  1.4* 1.9  PHOS  --   --  2.2*   Liver Function Tests: Recent Labs  Lab 04/20/20 1116  AST 28  ALT 31  ALKPHOS 87  BILITOT 0.6  PROT 8.0  ALBUMIN 4.0   No results  for input(s): LIPASE, AMYLASE in the last 168 hours. No  results for input(s): AMMONIA in the last 168 hours. CBC: Recent Labs  Lab 04/20/20 1116 04/21/20 0516 04/22/20 0443 04/23/20 0429  WBC 35.9* 36.6* 28.2* 19.8*  NEUTROABS 33.5*  --  25.0* 16.8*  HGB 13.5 11.5* 10.6* 10.2*  HCT 40.6 34.0* 32.2* 30.4*  MCV 93.5 92.1 94.2 94.1  PLT 565* 394 365 384   Cardiac Enzymes: No results for input(s): CKTOTAL, CKMB, CKMBINDEX, TROPONINI in the last 168 hours. BNP: Invalid input(s): POCBNP CBG: No results for input(s): GLUCAP in the last 168 hours. D-Dimer No results for input(s): DDIMER in the last 72 hours. Hgb A1c No results for input(s): HGBA1C in the last 72 hours. Lipid Profile No results for input(s): CHOL, HDL, LDLCALC, TRIG, CHOLHDL, LDLDIRECT in the last 72 hours. Thyroid function studies No results for input(s): TSH, T4TOTAL, T3FREE, THYROIDAB in the last 72 hours.  Invalid input(s): FREET3 Anemia work up No results for input(s): VITAMINB12, FOLATE, FERRITIN, TIBC, IRON, RETICCTPCT in the last 72 hours. Urinalysis    Component Value Date/Time   COLORURINE YELLOW 04/20/2020 1116   APPEARANCEUR HAZY (A) 04/20/2020 1116   LABSPEC 1.020 04/20/2020 1116   PHURINE 5.5 04/20/2020 1116   GLUCOSEU NEGATIVE 04/20/2020 1116   HGBUR MODERATE (A) 04/20/2020 1116   BILIRUBINUR NEGATIVE 04/20/2020 1116   KETONESUR NEGATIVE 04/20/2020 1116   PROTEINUR NEGATIVE 04/20/2020 1116   NITRITE POSITIVE (A) 04/20/2020 1116   LEUKOCYTESUR MODERATE (A) 04/20/2020 1116   Sepsis Labs Invalid input(s): PROCALCITONIN,  WBC,  LACTICIDVEN Microbiology Recent Results (from the past 240 hour(s))  Urine Culture     Status: Abnormal   Collection Time: 04/20/20 11:16 AM   Specimen: Urine, Random  Result Value Ref Range Status   Specimen Description   Final    URINE, RANDOM Performed at [facility], [address] [place], [place] [address]    Special Requests   Final    NONE Performed at [facility], [address] [place], [place] [address]    Culture MULTIPLE SPECIES PRESENT, SUGGEST RECOLLECTION (A)  Final   Report Status 04/22/2020 FINAL  Final  Resp Panel by RT-PCR (Flu A&B, Covid) Nasopharyngeal Swab     Status: None   Collection Time: 04/20/20 12:36 PM   Specimen: Nasopharyngeal Swab; Nasopharyngeal(NP) swabs in vial transport medium  Result Value Ref Range Status   SARS Coronavirus 2 by RT PCR NEGATIVE NEGATIVE Final    Comment: (NOTE) SARS-CoV-2 target nucleic acids are NOT DETECTED.  The SARS-CoV-2 RNA is generally detectable in upper respiratory specimens during the acute phase of infection. The lowest concentration of SARS-CoV-2 viral copies this assay can detect is 138 copies/mL. A negative result does not preclude SARS-Cov-2 infection and should not be used as the sole basis for treatment or other patient management decisions. A negative result may occur with  improper specimen collection/handling, submission of specimen other than nasopharyngeal swab, presence of viral mutation(s) within the areas targeted by this assay, and inadequate number of viral copies(<138 copies/mL). A negative result must be combined with clinical observations, patient history, and epidemiological information. The expected result is Negative.  Fact Sheet for Patients:  BloggerCourse.comhttps://www.fda.gov/media/152166/download  Fact Sheet for Healthcare Providers:  SeriousBroker.ithttps://www.fda.gov/media/152162/download  This test is no t yet approved or cleared by the [place] FDA and  has been authorized for detection and/or diagnosis of SARS-CoV-2 by FDA under an Emergency Use Authorization (EUA). This EUA will remain  in effect (meaning this test can be used) for the duration of the COVID-19 declaration under Section 564(b)(1) of the Act, 21 U.S.C.section 360bbb-3(b)(1), unless the authorization is terminated  or revoked sooner.       Influenza A by PCR NEGATIVE NEGATIVE Final   Influenza B by PCR NEGATIVE NEGATIVE  Final    Comment: (NOTE) The Xpert Xpress SARS-CoV-2/FLU/RSV plus assay is intended as an aid in the diagnosis of influenza from Nasopharyngeal swab specimens and should not be used as a sole basis for treatment. Nasal washings and aspirates are unacceptable for Xpert Xpress SARS-CoV-2/FLU/RSV testing.  Fact Sheet for Patients: BloggerCourse.com  Fact Sheet for Healthcare Providers: SeriousBroker.it  This test is not yet approved or cleared by the [place] FDA and has been authorized for detection and/or diagnosis of SARS-CoV-2 by FDA under an Emergency Use Authorization (EUA). This EUA will remain in effect (meaning this test can be used) for the duration of the COVID-19 declaration under Section 564(b)(1) of the Act, 21 U.S.C. section 360bbb-3(b)(1), unless the authorization is terminated or revoked.  Performed at [facility], [address] [place], [place] [address]   Culture, blood (routine x 2)     Status: Abnormal   Collection Time: 04/20/20  9:15 PM   Specimen: BLOOD  Result Value Ref Range Status   Specimen Description   Final    BLOOD BLOOD LEFT HAND Performed at [facility], [address] [place], [place] [address]    Special Requests   Final    BOTTLES DRAWN AEROBIC AND ANAEROBIC Blood Culture results may not be optimal due to an excessive volume of blood received in culture bottles Performed at [facility], [address] [place], [place] [address]    Culture  Setup Time   Final    Organism ID to follow GRAM NEGATIVE RODS ANAEROBIC BOTTLE ONLY CRITICAL RESULT CALLED TO, READ BACK BY AND VERIFIED WITH: NATHAN BELUE AT 0132 04/22/20. MF Performed at [facility], [address] [place], [place] [address]    Culture KLEBSIELLA PNEUMONIAE (A)  Final   Report Status 04/24/2020 FINAL  Final   Organism ID, Bacteria KLEBSIELLA PNEUMONIAE  Final      Susceptibility    Klebsiella pneumoniae - MIC*    AMPICILLIN RESISTANT Resistant     CEFAZOLIN <=4 SENSITIVE Sensitive     CEFEPIME <=0.12 SENSITIVE Sensitive     CEFTAZIDIME <=1 SENSITIVE Sensitive     CEFTRIAXONE <=0.25 SENSITIVE Sensitive     CIPROFLOXACIN <=0.25 SENSITIVE Sensitive     GENTAMICIN <=1 SENSITIVE Sensitive     IMIPENEM 1 SENSITIVE Sensitive     TRIMETH/SULFA <=20 SENSITIVE Sensitive     AMPICILLIN/SULBACTAM 4 SENSITIVE Sensitive     PIP/TAZO <=4 SENSITIVE Sensitive     * KLEBSIELLA PNEUMONIAE  Culture, blood (routine x 2)     Status: None (Preliminary result)   Collection Time: 04/20/20  9:15 PM   Specimen: BLOOD  Result Value Ref Range Status   Specimen Description BLOOD BLOOD RIGHT HAND  Final   Special Requests   Final    BOTTLES DRAWN AEROBIC AND ANAEROBIC Blood Culture results may not be optimal due to an excessive volume of blood received in culture bottles   Culture   Final    NO GROWTH 4 DAYS Performed at [facility], [address], [place], [place] [id number]    Report Status PENDING  Incomplete  Blood Culture ID Panel (Reflexed)  Status: Abnormal   Collection Time: 04/20/20  9:15 PM  Result Value Ref Range Status   Enterococcus faecalis NOT DETECTED NOT DETECTED Final   Enterococcus Faecium NOT DETECTED NOT DETECTED Final   Listeria monocytogenes NOT DETECTED NOT DETECTED Final   Staphylococcus species NOT DETECTED NOT DETECTED Final   Staphylococcus aureus (BCID) NOT DETECTED NOT DETECTED Final   Staphylococcus epidermidis NOT DETECTED NOT DETECTED Final   Staphylococcus lugdunensis NOT DETECTED NOT DETECTED Final   Streptococcus species NOT DETECTED NOT DETECTED Final   Streptococcus agalactiae NOT DETECTED NOT DETECTED Final   Streptococcus pneumoniae NOT DETECTED NOT DETECTED Final   Streptococcus pyogenes NOT DETECTED NOT DETECTED Final   A.calcoaceticus-baumannii NOT DETECTED NOT DETECTED Final   Bacteroides fragilis NOT DETECTED NOT DETECTED  Final   Enterobacterales DETECTED (A) NOT DETECTED Final    Comment: Enterobacterales represent a large order of gram negative bacteria, not a single organism. CRITICAL RESULT CALLED TO, READ BACK BY AND VERIFIED WITH: NATHAN BELUE AT 0132 04/22/20 MF.    Enterobacter cloacae complex NOT DETECTED NOT DETECTED Final   Escherichia coli NOT DETECTED NOT DETECTED Final   Klebsiella aerogenes NOT DETECTED NOT DETECTED Final   Klebsiella oxytoca NOT DETECTED NOT DETECTED Final   Klebsiella pneumoniae DETECTED (A) NOT DETECTED Final    Comment: CRITICAL RESULT CALLED TO, READ BACK BY AND VERIFIED WITH: NATHAN BELUE AT 0132 04/22/20 MF.    Proteus species NOT DETECTED NOT DETECTED Final   Salmonella species NOT DETECTED NOT DETECTED Final   Serratia marcescens NOT DETECTED NOT DETECTED Final   Haemophilus influenzae NOT DETECTED NOT DETECTED Final   Neisseria meningitidis NOT DETECTED NOT DETECTED Final   Pseudomonas aeruginosa NOT DETECTED NOT DETECTED Final   Stenotrophomonas maltophilia NOT DETECTED NOT DETECTED Final   Candida albicans NOT DETECTED NOT DETECTED Final   Candida auris NOT DETECTED NOT DETECTED Final   Candida glabrata NOT DETECTED NOT DETECTED Final   Candida krusei NOT DETECTED NOT DETECTED Final   Candida parapsilosis NOT DETECTED NOT DETECTED Final   Candida tropicalis NOT DETECTED NOT DETECTED Final   Cryptococcus neoformans/gattii NOT DETECTED NOT DETECTED Final   CTX-M ESBL NOT DETECTED NOT DETECTED Final   Carbapenem resistance IMP NOT DETECTED NOT DETECTED Final   Carbapenem resistance KPC NOT DETECTED NOT DETECTED Final   Carbapenem resistance NDM NOT DETECTED NOT DETECTED Final   Carbapenem resist OXA 48 LIKE NOT DETECTED NOT DETECTED Final   Carbapenem resistance VIM NOT DETECTED NOT DETECTED Final    Comment: Performed at [facility], [address], [place], [place] [address]     Time coordinating discharge: Over 30 minutes  SIGNED:   Lynn Ito, MD  [facility] 04/24/2020, 1:30 PM Pager   If 7PM-7AM, please contact night-coverage [email or web address] Password [email or web address]

## 2020-04-24 NOTE — Progress Notes (Signed)
Laura Adams to be D/C'd home with husband per MD order.  Discussed prescriptions and follow up appointments with the patient. Prescriptions given to patient, medication list explained in detail. Pt verbalized understanding.  Allergies as of 04/24/2020   No Known Allergies      Medication List     STOP taking these medications    amoxicillin-clavulanate 875-125 MG tablet Commonly known as: AUGMENTIN   hydrochlorothiazide 12.5 MG capsule Commonly known as: MICROZIDE       TAKE these medications    ciprofloxacin 500 MG tablet Commonly known as: CIPRO Take 1 tablet (500 mg total) by mouth 2 (two) times daily for 10 days.   gemfibrozil 600 MG tablet Commonly known as: LOPID Take 600 mg by mouth 2 (two) times daily.   niacin 500 MG CR tablet Commonly known as: NIASPAN Take 500 mg by mouth daily.   Nortrel 7/7/7 0.5/0.75/1-35 MG-MCG tablet Generic drug: norethindrone-ethinyl estradiol Take by mouth.   omeprazole 20 MG capsule Commonly known as: PRILOSEC Take 1 capsule by mouth daily.   oxyCODONE 5 MG immediate release tablet Commonly known as: Oxy IR/ROXICODONE Take 1 tablet (5 mg total) by mouth every 4 (four) hours as needed for up to 3 days for moderate pain or breakthrough pain.   phosphorus 155-852-130 MG tablet Commonly known as: K PHOS NEUTRAL Take 1 tablet (250 mg total) by mouth 3 (three) times daily for 2 days.   promethazine 25 MG tablet Commonly known as: PHENERGAN Take 25 mg by mouth every 6 (six) hours as needed.   SUMAtriptan 100 MG tablet Commonly known as: IMITREX Take by mouth.        Vitals:   04/24/20 0805 04/24/20 1141  BP: (!) 149/77 132/68  Pulse: 77 83  Resp: 16   Temp: 98.8 F (37.1 C) 98.2 F (36.8 C)  SpO2: 97% 95%    Skin clean, dry and intact without evidence of skin break down, no evidence of skin tears noted. IV catheter discontinued intact. Site without signs and symptoms of complications. Dressing and pressure  applied. Pt denies pain at this time. No complaints noted.  An After Visit Summary was printed and given to the patient. Patient escorted via WC, and D/C home via private auto.  Belal Scallon C. Jilda Roche

## 2020-04-25 LAB — CULTURE, BLOOD (ROUTINE X 2): Culture: NO GROWTH

## 2020-04-26 ENCOUNTER — Telehealth: Payer: Self-pay

## 2020-04-26 NOTE — Telephone Encounter (Signed)
Pt LVM on triage line stating that she is unsure if we should complete her FMLA paperwork as one of our Providers did her surgery or if she should have it sent to her PCP.

## 2020-04-30 ENCOUNTER — Telehealth: Payer: Self-pay | Admitting: Urology

## 2020-04-30 NOTE — Telephone Encounter (Signed)
Called pt. Gave her office fax number to fax FMLA paperwork and informed her of the 20.00 charge. Pt. Stated understanding and will call the office or come by to pay after her appointment with Dr. Richardo Hanks tomorrow 05/01/20.

## 2020-05-01 ENCOUNTER — Ambulatory Visit: Payer: Managed Care, Other (non HMO) | Admitting: Urology

## 2020-05-01 ENCOUNTER — Other Ambulatory Visit: Payer: Self-pay | Admitting: Urology

## 2020-05-01 ENCOUNTER — Encounter: Payer: Self-pay | Admitting: Urology

## 2020-05-01 ENCOUNTER — Other Ambulatory Visit
Admission: RE | Admit: 2020-05-01 | Discharge: 2020-05-01 | Disposition: A | Discharge status: Home / Self Care | Payer: Managed Care, Other (non HMO) | Attending: Urology | Admitting: Urology

## 2020-05-01 ENCOUNTER — Other Ambulatory Visit: Payer: Self-pay

## 2020-05-01 VITALS — BP 132/75 | HR 103 | Ht 67.0 in | Wt 162.0 lb

## 2020-05-01 DIAGNOSIS — N200 Calculus of kidney: Secondary | ICD-10-CM | POA: Diagnosis present

## 2020-05-01 LAB — URINALYSIS, COMPLETE (UACMP) WITH MICROSCOPIC
Bilirubin Urine: NEGATIVE
Glucose, UA: NEGATIVE mg/dL
Ketones, ur: NEGATIVE mg/dL
Nitrite: NEGATIVE
Protein, ur: 30 mg/dL — AB
Specific Gravity, Urine: 1.01 (ref 1.005–1.030)
WBC, UA: >50 WBC/hpf (ref 0–5)
pH: 6.5 (ref 5.0–8.0)

## 2020-05-01 MED ORDER — SULFAMETHOXAZOLE-TRIMETHOPRIM 800-160 MG PO TABS: 1.0000 | ORAL_TABLET | Freq: Every day | ORAL | 0 refills | Status: DC

## 2020-05-01 NOTE — Progress Notes (Signed)
   05/01/2020 10:20 AM   Laura Adams [birth year] [id number]  Reason for visit: Left staghorn stone and UTI  HPI: I saw Ms. Laura Adams in urology clinic today for the above issues.  She was admitted on 04/20/2020 with sepsis from urinary source and CT showing a 1 cm left proximal ureteral stone and a 3 cm left complete staghorn stone.  There are no right-sided stones.  She underwent urgent stent placement with Dr. Alvester Morin.  Urine and blood cultures grew Klebsiella pneumoniae resistant to ampicillin.  She is about to complete a 2-week course of culture appropriate Cipro.  She denies any prior surgeries for kidney stones.  She thinks she may have passed a few small ones in the distant past, but never that required hospitalization.  She is otherwise healthy.  She is having minimal stent related symptoms at this time.  We discussed that management is based on stone size, location, density, patient co-morbidities, and patient preference.   Ureteroscopy with laser lithotripsy and stent placement has a higher stone free rate than SWL in a single procedure, however increased complication rate including possible infection, ureteral injury, bleeding, and stent related morbidity. Common stent related symptoms include dysuria, urgency/frequency, and flank pain.  She has a very large stone burden I do not think she would be a good candidate for staged ureteroscopy with high risk of residual stone burden.  PCNL is the favored treatment for stones >2cm. It involves a small incision in the flank, with complete fragmentation of stones and removal. It has the highest stone free rate, but also the highest complication rate. Possible complications include bleeding, infection/sepsis, injury to surrounding organs including the pleura, and collecting system injury.  We discussed a 1 to 2-day hospitalization, and need for ureteral stent for about a week post procedure with goal to remove her nephrostomy tube prior to  discharge.  After an extensive discussion of the risks and benefits of the above treatment options, the patient would like to proceed with left PCNL.  Schedule left PCNL next week, will coordinate with IR Urinalysis and culture today Prophylactic Bactrim after completion of Cipro  Sondra Come, MD  [facility] [address] [place], [place] [address] [phone]

## 2020-05-01 NOTE — Patient Instructions (Addendum)
Percutaneous Nephrolithotomy Percutaneous nephrolithotomy is a procedure to remove kidney stones. Kidney stones are deposits that form inside your kidneys and can cause pain. You may need this procedure if:  You have large kidney stones. Kidney stones that are bigger than 2 cm (0.78 in.) wide may require this procedure.  Your kidney stones are oddly shaped.  Other treatments have not been successful in helping the kidney stones to pass.  You have developed an infection due to the kidney stones. Tell a health care provider about:  Any allergies you have.  All medicines you are taking, including vitamins, herbs, eye drops, creams, and over-the-counter medicines.  Any problems you or family members have had with anesthetic medicines.  Any blood disorders you have.  Any surgeries you have had.  Any medical conditions you have.  Whether you are pregnant or may be pregnant.  Whether you use any tobacco products, including cigarettes, chewing tobacco, or e-cigarettes. What are the risks? Generally, this is a safe procedure. However, problems may occur, including:  Infection.  Bleeding. This may include blood in your urine.  Allergic reactions to medicines.  Damage to other structures or organs.  Kidney damage.  Holes in the kidney. These often heal on their own.  Numbness or tingling in the affected area.  Inability to remove all the stones. You may need a different procedure to complete treatment. What happens before the procedure? Staying hydrated Follow instructions from your health care provider about hydration, which may include:  Up to 2 hours before the procedure - you may continue to drink clear liquids, such as water, clear fruit juice, black coffee, and plain tea.   Eating and drinking restrictions Follow instructions from your health care provider about eating and drinking, which may include:  8 hours before the procedure - stop eating heavy meals or foods,  such as meat, fried foods, or fatty foods.  6 hours before the procedure - stop eating light meals or foods, such as toast or cereal.  6 hours before the procedure - stop drinking milk or drinks that contain milk.  2 hours before the procedure - stop drinking clear liquids. Medicines Ask your health care provider about:  Changing or stopping your regular medicines. This is especially important if you are taking diabetes medicines or blood thinners.  Taking medicines such as aspirin and ibuprofen. These medicines can thin your blood. Do not take these medicines unless your health care provider tells you to take them.  Taking over-the-counter medicines, vitamins, herbs, and supplements. Tests You may have tests, including:  Blood tests.  Urine tests.  Tests to check how your heart is working.  Imaging studies. These are used to identify: ? The size and number (stone burden) of the kidney stones. ? The position of the kidney stones. General instructions  Plan to have someone take you home from the hospital or clinic.  Plan to have a responsible adult care for you for at least 24 hours after you leave the hospital or clinic. This is important.  Ask your health care provider how your surgical site will be marked or identified.  Ask your health care provider what steps will be taken to help prevent infection. These may include: ? Removing hair at the surgery site. ? Washing skin with a germ-killing soap. ? Taking antibiotic medicine. What happens during the procedure?  An IV will be inserted into one of your veins.  The site of the procedure will be marked.  You will be   given one or more of the following: ? A medicine to help you relax (sedative). ? A medicine to numb the area (local anesthetic). ? A medicine to make you fall asleep (general anesthetic). ? A medicine that is injected into your spine to numb the area below and slightly above the injection site (spinal  anesthetic). ? A medicine that is injected into an area of your body to numb everything below the injection site (regional anesthetic).  A thin tube (urinary catheter) will be put in your bladder to drain urine during and after the procedure.  Your surgeon will make a small cut (incision) in your lower back.  A tube will be inserted through the incision into your kidney.  Each kidney stone will be removed through this tube. Larger stones may need to be broken up with a high-intensity light beam (laser) or other tools.  After all of the stones have been removed, your health care provider may put in tubes to drain your bladder. Based on your condition: ? An internal tube, called a stent, may be put in your ureter. This will help drain urine from your kidney to your bladder. ? A surgical drain (nephrostomy tube) may be put in your kidney. The tube comes out through the incision in your lower back. This will help to drain urine or any fluid that builds up while your kidney heals.  Part of the incision may be closed with stitches (sutures).  A bandage (dressing) will be placed over the incision area. The procedure may vary among health care providers and hospitals.   What happens after the procedure?  Your blood pressure, heart rate, breathing rate, and blood oxygen level will be monitored until you leave the hospital or clinic.  You may be given medicine for pain.  You will be shown how to do breathing exercises, such as coughing and breathing deeply. These will help to prevent pneumonia.  You will be encouraged to walk. Walking helps to prevent blood clots.  Your stent and urinary catheter will be removed after 1-2 days if there is only a small amount of blood in your urine.  You will be taught how to care for the catheter or nephrostomy tube, if you have them.  Do not drive for 24 hours if you were given a sedative during your procedure. Summary  Percutaneous nephrolithotomy is a  procedure to remove kidney stones.  Ask your health care provider about changing or stopping your regular medicines.  Before surgery, follow instructions from your health care provider about eating and drinking.  Plan to have someone take you home from the hospital or clinic. This information is not intended to replace advice given to you by your health care provider. Make sure you discuss any questions you have with your health care provider. Document Revised: 02/25/2018 Document Reviewed: 07/22/2017 Elsevier Patient Education  2021 Elsevier Inc.  Percutaneous Nephrolithotomy, Care After This sheet gives you information about how to care for yourself after your procedure. Your health care provider may also give you more specific instructions. If you have problems or questions, contact your health care provider. What can I expect after the procedure? After the procedure, it is common to have:  Soreness or pain.  A small amount of blood or clear fluid coming from your incision for a few days.  Fatigue.  Some blood in your urine. This will last for a few days. Follow these instructions at home: Medicines  Take over-the-counter and prescription medicines only as   told by your health care provider.  If you were prescribed an antibiotic medicine, take it as told by your health care provider. Do not stop using the antibiotic even if you start to feel better.  Ask your health care provider if the medicine prescribed to you: ? Requires you to avoid driving or using heavy machinery. ? Can cause constipation. You may need to take these actions to prevent or treat constipation:  Drink enough fluid to keep your urine pale yellow.  Take over-the-counter or prescription medicines.  Eat foods that are high in fiber, such as beans, whole grains, and fresh fruits and vegetables.  Limit foods that are high in fat and processed sugars, such as fried or sweet foods. Incision care  Follow  instructions from your health care provider about how to take care of your incision. Make sure you: ? Wash your hands with soap and water before and after you change your bandage (dressing). If soap and water are not available, use hand sanitizer. ? Change your dressing as told by your health care provider. ? Leave stitches (sutures), skin glue, or adhesive strips in place. These skin closures may need to stay in place for 2 weeks or longer. If adhesive strip edges start to loosen and curl up, you may trim the loose edges. Do not remove adhesive strips completely unless your health care provider tells you to do that.  Check your incision area every day for signs of infection. Check for: ? Redness, swelling, or pain. ? More fluid or blood. ? Warmth. ? Pus or a bad smell.  Do not take baths, swim, or use a hot tub until your health care provider approves. Ask your health care provider if you may take showers. You may only be allowed to take sponge baths.   Activity  Avoid strenuous activities for as long as told by your health care provider.  Return to your normal activities as told by your health care provider. Ask your health care provider what activities are safe for you. General instructions  If you were sent home with a catheter or surgical drain (nephrostomy tube), follow your health care provider's instructions on how to take care of it.  Wear compression stockings as told by your health care provider. These stockings help to prevent blood clots and reduce swelling in your legs.  Do not use any products that contain nicotine or tobacco, such as cigarettes, e-cigarettes, and chewing tobacco. These can delay incision healing after surgery. If you need help quitting, ask your health care provider.  Keep all follow-up visits as told by your health care provider. This is important. ? If you still have a stent, your health care provider will need to remove it after 1-2 weeks. Contact a  health care provider if:  You have a fever.  You have redness, swelling, or pain around your incision.  You have more fluid or blood coming from your incision.  Your incision feels warm to the touch.  You have pus or a bad smell coming from your incision.  You lose your appetite.  You feel nauseous or you vomit.  You have been sent home with a urinary catheter or a surgical drain and urine flow suddenly stops, followed by kidney pain. Get help right away if:  You notice a large amount of blood in your urine.  You have blood clots in your urine.  You cannot urinate.  You have chest pain or trouble breathing. Summary  After the procedure,   it is common to feel soreness and discomfort. You may also see blood in your urine.  You will be told how to care for yourself after the procedure. Follow instructions on how to care for your incision and how to recognize signs of infection.  Avoid activities that require great effort. Return to activities as told by your health care provider.  Get help right away if you have blood clots in your urine, you cannot urinate, or you have trouble breathing. This information is not intended to replace advice given to you by your health care provider. Make sure you discuss any questions you have with your health care provider. Document Revised: 07/22/2017 Document Reviewed: 07/22/2017 Elsevier Patient Education  2021 Elsevier Inc.  

## 2020-05-02 LAB — URINE CULTURE: Culture: NO GROWTH

## 2020-05-03 ENCOUNTER — Encounter
Admission: RE | Admit: 2020-05-03 | Discharge: 2020-05-03 | Disposition: A | Discharge status: Home / Self Care | Payer: Managed Care, Other (non HMO) | Source: Ambulatory Visit | Attending: Urology | Admitting: Urology

## 2020-05-03 ENCOUNTER — Other Ambulatory Visit: Payer: Self-pay

## 2020-05-03 HISTORY — DX: Other specified postprocedural states: R11.2

## 2020-05-03 HISTORY — DX: Other specified postprocedural states: Z98.890

## 2020-05-03 HISTORY — DX: Gastro-esophageal reflux disease without esophagitis: K21.9

## 2020-05-03 HISTORY — DX: Personal history of urinary calculi: Z87.442

## 2020-05-03 NOTE — Patient Instructions (Signed)
Your procedure is scheduled on: 05/07/20 Report to DAY SURGERY DEPARTMENT LOCATED ON 2ND FLOOR MEDICAL MALL ENTRANCE. To find out your arrival time please call [phone] between 1PM - 3PM on 05/04/20.  Remember: Instructions that are not followed completely may result in serious medical risk, up to and including death, or upon the discretion of your surgeon and anesthesiologist your surgery may need to be rescheduled.     _X__ 1. Do not eat food after midnight the night before your procedure.                 No gum chewing or hard candies. You may drink clear liquids up to 2 hours                 before you are scheduled to arrive for your surgery- DO not drink clear                 liquids within 2 hours of the start of your surgery.                 Clear Liquids include:  water, apple juice without pulp, clear carbohydrate                 drink such as Clearfast or Gatorade, Black Coffee or Tea (Do not add                 anything to coffee or tea). Diabetics water only  __X__2.  On the morning of surgery brush your teeth with toothpaste and water, you                 may rinse your mouth with mouthwash if you wish.  Do not swallow any              toothpaste of mouthwash.     _X__ 3.  No Alcohol for 24 hours before or after surgery.   _X__ 4.  Do Not Smoke or use e-cigarettes For 24 Hours Prior to Your Surgery.                 Do not use any chewable tobacco products for at least 6 hours prior to                 surgery.  ____  5.  Bring all medications with you on the day of surgery if instructed.   __X__  6.  Notify your doctor if there is any change in your medical condition      (cold, fever, infections).     Do not wear jewelry, make-up, hairpins, clips or nail polish. Do not wear lotions, powders, or perfumes.  Do not shave 48 hours prior to surgery. Men may shave face and neck. Do not bring valuables to the hospital.    [facility] is not responsible for any belongings or  valuables.  Contacts, dentures/partials or body piercings may not be worn into surgery. Bring a case for your contacts, glasses or hearing aids, a denture cup will be supplied. Leave your suitcase in the car. After surgery it may be brought to your room. For patients admitted to the hospital, discharge time is determined by your treatment team.   Patients discharged the day of surgery will not be allowed to drive home.   Please read over the following fact sheets that you were given:   Chg soap  __X__ Take these medicines the morning of surgery with A SIP OF WATER:  1. cetirizine (ZYRTEC) 10 MG tablet  2. gemfibrozil (LOPID) 600 MG tablet  3. omeprazole (PRILOSEC) 20 MG capsule  4.  5.  6.  ____ Fleet Enema (as directed)   __X__ Use CHG Soap/SAGE wipes as directed  ____ Use inhalers on the day of surgery  ____ Stop metformin/Janumet/Farxiga 2 days prior to surgery    ____ Take 1/2 of usual insulin dose the night before surgery. No insulin the morning          of surgery.   ____ Stop Blood Thinners Coumadin/Plavix/Xarelto/Pleta/Pradaxa/Eliquis/Effient/Aspirin  on   Or contact your Surgeon, Cardiologist or Medical Doctor regarding  ability to stop your blood thinners  __X__ Stop Anti-inflammatories 7 days before surgery such as Advil, Ibuprofen, Motrin,  BC or Goodies Powder, Naprosyn, Naproxen, Aleve, Aspirin    __X__ Stop all herbal supplements, fish oil or vitamin E until after surgery.    ____ Bring C-Pap to the hospital.

## 2020-05-03 NOTE — Progress Notes (Signed)
Patient on schedule for PCNL 05/07/2020, called and spoke with patient on phone with pre procedure instructions given. Made aware to be NPO after MN prior to procedure,SDS will contact patient of when to arrive prior to coming to specials for first procedure. Stated understanding.

## 2020-05-04 ENCOUNTER — Other Ambulatory Visit: Payer: Self-pay | Admitting: Student

## 2020-05-04 ENCOUNTER — Other Ambulatory Visit
Admission: RE | Admit: 2020-05-04 | Discharge: 2020-05-04 | Disposition: A | Discharge status: Home / Self Care | Payer: Managed Care, Other (non HMO) | Source: Ambulatory Visit | Attending: Urology | Admitting: Urology

## 2020-05-04 DIAGNOSIS — Z01812 Encounter for preprocedural laboratory examination: Secondary | ICD-10-CM | POA: Diagnosis not present

## 2020-05-04 DIAGNOSIS — Z20822 Contact with and (suspected) exposure to covid-19: Secondary | ICD-10-CM | POA: Insufficient documentation

## 2020-05-04 LAB — PROTIME-INR
INR: 1 (ref 0.8–1.2)
Prothrombin Time: 12.7 seconds (ref 11.4–15.2)

## 2020-05-04 LAB — BASIC METABOLIC PANEL
Anion gap: 12 (ref 5–15)
BUN: 17 mg/dL (ref 6–20)
CO2: 22 mmol/L (ref 22–32)
Calcium: 9.9 mg/dL (ref 8.9–10.3)
Chloride: 104 mmol/L (ref 98–111)
Creatinine, Ser: 0.77 mg/dL (ref 0.44–1.00)
GFR, Estimated: >60 mL/min (ref >60)
Glucose, Bld: 83 mg/dL (ref 70–99)
Potassium: 3.7 mmol/L (ref 3.5–5.1)
Sodium: 138 mmol/L (ref 135–145)

## 2020-05-04 LAB — TYPE AND SCREEN
ABO/RH(D): O NEG
Antibody Screen: NEGATIVE

## 2020-05-04 LAB — CBC
HCT: 43.5 % (ref 36.0–46.0)
Hemoglobin: 13.5 g/dL (ref 12.0–15.0)
MCH: 29.7 pg (ref 26.0–34.0)
MCHC: 31 g/dL (ref 30.0–36.0)
MCV: 95.8 fL (ref 80.0–100.0)
Platelets: 569 10*3/uL — ABNORMAL HIGH (ref 150–400)
RBC: 4.54 MIL/uL (ref 3.87–5.11)
RDW: 12.7 % (ref 11.5–15.5)
WBC: 9.2 10*3/uL (ref 4.0–10.5)
nRBC: 0 % (ref 0.0–0.2)

## 2020-05-04 LAB — SARS CORONAVIRUS 2 (TAT 6-24 HRS): SARS Coronavirus 2: NEGATIVE

## 2020-05-07 ENCOUNTER — Ambulatory Visit: Payer: Managed Care, Other (non HMO) | Admitting: Anesthesiology

## 2020-05-07 ENCOUNTER — Ambulatory Visit: Payer: Managed Care, Other (non HMO)

## 2020-05-07 ENCOUNTER — Observation Stay
Admission: RE | Admit: 2020-05-07 | Discharge: 2020-05-09 | Disposition: A | Discharge status: Home / Self Care | Payer: Managed Care, Other (non HMO) | Attending: Urology | Admitting: Urology

## 2020-05-07 ENCOUNTER — Observation Stay: Payer: Managed Care, Other (non HMO)

## 2020-05-07 ENCOUNTER — Encounter: Payer: Self-pay | Admitting: Urology

## 2020-05-07 ENCOUNTER — Other Ambulatory Visit: Payer: Self-pay

## 2020-05-07 ENCOUNTER — Encounter
Admission: RE | Disposition: A | Discharge status: Home / Self Care | Payer: Self-pay | Source: Home / Self Care | Attending: Urology

## 2020-05-07 ENCOUNTER — Ambulatory Visit
Admission: RE | Admit: 2020-05-07 | Discharge: 2020-05-07 | Disposition: A | Discharge status: Home / Self Care | Payer: Managed Care, Other (non HMO) | Source: Ambulatory Visit | Attending: Urology | Admitting: Urology

## 2020-05-07 DIAGNOSIS — N200 Calculus of kidney: Principal | ICD-10-CM | POA: Insufficient documentation

## 2020-05-07 DIAGNOSIS — I10 Essential (primary) hypertension: Secondary | ICD-10-CM | POA: Insufficient documentation

## 2020-05-07 DIAGNOSIS — Z79899 Other long term (current) drug therapy: Secondary | ICD-10-CM | POA: Insufficient documentation

## 2020-05-07 DIAGNOSIS — Z419 Encounter for procedure for purposes other than remedying health state, unspecified: Secondary | ICD-10-CM

## 2020-05-07 DIAGNOSIS — N132 Hydronephrosis with renal and ureteral calculous obstruction: Secondary | ICD-10-CM | POA: Insufficient documentation

## 2020-05-07 HISTORY — PX: NEPHROLITHOTOMY: SHX5134

## 2020-05-07 HISTORY — PX: IR NEPHROSTOMY PLACEMENT LEFT: IMG6063

## 2020-05-07 LAB — CBC
HCT: 38.5 % (ref 36.0–46.0)
Hemoglobin: 12.6 g/dL (ref 12.0–15.0)
MCH: 30.3 pg (ref 26.0–34.0)
MCHC: 32.7 g/dL (ref 30.0–36.0)
MCV: 92.5 fL (ref 80.0–100.0)
Platelets: 498 10*3/uL — ABNORMAL HIGH (ref 150–400)
RBC: 4.16 MIL/uL (ref 3.87–5.11)
RDW: 12.9 % (ref 11.5–15.5)
WBC: 8.8 10*3/uL (ref 4.0–10.5)
nRBC: 0 % (ref 0.0–0.2)

## 2020-05-07 LAB — APTT: aPTT: 30 seconds (ref 24–36)

## 2020-05-07 LAB — ABO/RH: ABO/RH(D): O NEG

## 2020-05-07 LAB — PROTIME-INR
INR: 1 (ref 0.8–1.2)
Prothrombin Time: 13.1 seconds (ref 11.4–15.2)

## 2020-05-07 LAB — POCT PREGNANCY, URINE: Preg Test, Ur: NEGATIVE

## 2020-05-07 SURGERY — NEPHROLITHOTOMY PERCUTANEOUS
Anesthesia: General | Laterality: Left

## 2020-05-07 MED ORDER — LIDOCAINE HCL (PF) 2 % IJ SOLN
INTRAMUSCULAR | Status: DC | PRN
  Administered 2020-05-07: 80 mg via INTRADERMAL

## 2020-05-07 MED ORDER — LORATADINE 10 MG PO TABS
10.0000 mg | ORAL_TABLET | Freq: Every day | ORAL | Status: DC
  Administered 2020-05-08 – 2020-05-09 (×2): 10 mg via ORAL
  Filled 2020-05-07 (×2): qty 1

## 2020-05-07 MED ORDER — MIDAZOLAM HCL 2 MG/2ML IJ SOLN
INTRAMUSCULAR | Status: AC
  Filled 2020-05-07: qty 2

## 2020-05-07 MED ORDER — FENTANYL CITRATE (PF) 100 MCG/2ML IJ SOLN
INTRAMUSCULAR | Status: DC | PRN
  Administered 2020-05-07 (×4): 50 ug via INTRAVENOUS

## 2020-05-07 MED ORDER — PANTOPRAZOLE SODIUM 40 MG PO TBEC
40.0000 mg | DELAYED_RELEASE_TABLET | Freq: Every day | ORAL | Status: DC
  Administered 2020-05-08 – 2020-05-09 (×2): 40 mg via ORAL
  Filled 2020-05-07 (×2): qty 1

## 2020-05-07 MED ORDER — PROPOFOL 10 MG/ML IV BOLUS
INTRAVENOUS | Status: DC | PRN
  Administered 2020-05-07: 50 mg via INTRAVENOUS
  Administered 2020-05-07: 30 mg via INTRAVENOUS
  Administered 2020-05-07: 150 mg via INTRAVENOUS
  Administered 2020-05-07: 20 mg via INTRAVENOUS

## 2020-05-07 MED ORDER — ROCURONIUM BROMIDE 100 MG/10ML IV SOLN
INTRAVENOUS | Status: DC | PRN
  Administered 2020-05-07: 10 mg via INTRAVENOUS
  Administered 2020-05-07: 20 mg via INTRAVENOUS
  Administered 2020-05-07: 10 mg via INTRAVENOUS
  Administered 2020-05-07: 40 mg via INTRAVENOUS

## 2020-05-07 MED ORDER — FENTANYL CITRATE (PF) 100 MCG/2ML IJ SOLN
INTRAMUSCULAR | Status: AC
  Filled 2020-05-07: qty 2

## 2020-05-07 MED ORDER — CHLORHEXIDINE GLUCONATE 0.12 % MT SOLN
OROMUCOSAL | Status: AC
  Filled 2020-05-07: qty 15

## 2020-05-07 MED ORDER — ONDANSETRON HCL 4 MG/2ML IJ SOLN
4.0000 mg | INTRAMUSCULAR | Status: DC | PRN
  Administered 2020-05-08 – 2020-05-09 (×2): 4 mg via INTRAVENOUS
  Filled 2020-05-07 (×2): qty 2

## 2020-05-07 MED ORDER — IOHEXOL 180 MG/ML  SOLN
INTRAMUSCULAR | Status: DC | PRN
  Administered 2020-05-07: 10 mL

## 2020-05-07 MED ORDER — DEXMEDETOMIDINE (PRECEDEX) IN NS 20 MCG/5ML (4 MCG/ML) IV SYRINGE
PREFILLED_SYRINGE | INTRAVENOUS | Status: DC | PRN
  Administered 2020-05-07 (×2): 10 ug via INTRAVENOUS

## 2020-05-07 MED ORDER — OXYCODONE HCL 5 MG PO TABS
5.0000 mg | ORAL_TABLET | ORAL | Status: DC | PRN
  Administered 2020-05-08 – 2020-05-09 (×5): 5 mg via ORAL
  Filled 2020-05-07 (×5): qty 1

## 2020-05-07 MED ORDER — ACETAMINOPHEN 325 MG PO TABS: 650.0000 mg | ORAL_TABLET | ORAL | Status: DC | PRN

## 2020-05-07 MED ORDER — CIPROFLOXACIN IN D5W 200 MG/100ML IV SOLN
INTRAVENOUS | Status: AC | PRN
  Administered 2020-05-07: 400 mg via INTRAVENOUS

## 2020-05-07 MED ORDER — SENNOSIDES-DOCUSATE SODIUM 8.6-50 MG PO TABS: 1.0000 | ORAL_TABLET | Freq: Every evening | ORAL | Status: DC | PRN

## 2020-05-07 MED ORDER — OXYBUTYNIN CHLORIDE 5 MG PO TABS
ORAL_TABLET | ORAL | Status: AC
  Administered 2020-05-07: 5 mg via ORAL
  Filled 2020-05-07: qty 1

## 2020-05-07 MED ORDER — SUGAMMADEX SODIUM 200 MG/2ML IV SOLN
INTRAVENOUS | Status: DC | PRN
  Administered 2020-05-07: 200 mg via INTRAVENOUS

## 2020-05-07 MED ORDER — PROPOFOL 10 MG/ML IV BOLUS
INTRAVENOUS | Status: AC
  Filled 2020-05-07: qty 20

## 2020-05-07 MED ORDER — SODIUM CHLORIDE 0.9 % IV SOLN: INTRAVENOUS | Status: DC

## 2020-05-07 MED ORDER — FLUTICASONE PROPIONATE 50 MCG/ACT NA SUSP
1.0000 | Freq: Every day | NASAL | Status: DC
  Administered 2020-05-08 – 2020-05-09 (×2): 1 via NASAL
  Filled 2020-05-07: qty 16

## 2020-05-07 MED ORDER — ACETAMINOPHEN 10 MG/ML IV SOLN: 1000.0000 mg | Freq: Once | INTRAVENOUS | Status: DC | PRN

## 2020-05-07 MED ORDER — KETOROLAC TROMETHAMINE 30 MG/ML IJ SOLN
INTRAMUSCULAR | Status: AC
  Filled 2020-05-07: qty 1

## 2020-05-07 MED ORDER — ACETAMINOPHEN 500 MG PO TABS: 1000.0000 mg | ORAL_TABLET | Freq: Four times a day (QID) | ORAL | Status: DC | PRN

## 2020-05-07 MED ORDER — LACTATED RINGERS IV SOLN: INTRAVENOUS | Status: DC

## 2020-05-07 MED ORDER — CEFAZOLIN SODIUM-DEXTROSE 2-4 GM/100ML-% IV SOLN
2.0000 g | INTRAVENOUS | Status: AC
  Administered 2020-05-07: 2 g via INTRAVENOUS

## 2020-05-07 MED ORDER — OXYCODONE HCL 5 MG/5ML PO SOLN: 5.0000 mg | Freq: Once | ORAL | Status: AC | PRN

## 2020-05-07 MED ORDER — DEXAMETHASONE SODIUM PHOSPHATE 10 MG/ML IJ SOLN
INTRAMUSCULAR | Status: DC | PRN
  Administered 2020-05-07: 10 mg via INTRAVENOUS

## 2020-05-07 MED ORDER — FENTANYL CITRATE (PF) 100 MCG/2ML IJ SOLN
INTRAMUSCULAR | Status: AC | PRN
  Administered 2020-05-07 (×2): 50 ug via INTRAVENOUS

## 2020-05-07 MED ORDER — CIPROFLOXACIN IN D5W 400 MG/200ML IV SOLN: 400.0000 mg | Freq: Once | INTRAVENOUS | Status: DC

## 2020-05-07 MED ORDER — PROPOFOL 500 MG/50ML IV EMUL
INTRAVENOUS | Status: DC | PRN
  Administered 2020-05-07: 150 ug/kg/min via INTRAVENOUS

## 2020-05-07 MED ORDER — ONDANSETRON HCL 4 MG/2ML IJ SOLN
INTRAMUSCULAR | Status: DC | PRN
  Administered 2020-05-07: 4 mg via INTRAVENOUS

## 2020-05-07 MED ORDER — OXYBUTYNIN CHLORIDE 5 MG PO TABS
5.0000 mg | ORAL_TABLET | Freq: Three times a day (TID) | ORAL | Status: DC | PRN
  Administered 2020-05-08 – 2020-05-09 (×2): 5 mg via ORAL
  Filled 2020-05-07 (×2): qty 1

## 2020-05-07 MED ORDER — LIDOCAINE HCL (PF) 2 % IJ SOLN
INTRAMUSCULAR | Status: AC
  Filled 2020-05-07: qty 5

## 2020-05-07 MED ORDER — NIACIN ER (ANTIHYPERLIPIDEMIC) 500 MG PO TBCR
500.0000 mg | EXTENDED_RELEASE_TABLET | Freq: Every evening | ORAL | Status: DC
  Administered 2020-05-07 – 2020-05-08 (×2): 500 mg via ORAL
  Filled 2020-05-07 (×3): qty 1

## 2020-05-07 MED ORDER — ONDANSETRON HCL 4 MG/2ML IJ SOLN
INTRAMUSCULAR | Status: AC
  Filled 2020-05-07: qty 2

## 2020-05-07 MED ORDER — CIPROFLOXACIN IN D5W 400 MG/200ML IV SOLN
INTRAVENOUS | Status: AC
  Filled 2020-05-07: qty 200

## 2020-05-07 MED ORDER — CHLORHEXIDINE GLUCONATE 0.12 % MT SOLN
15.0000 mL | Freq: Once | OROMUCOSAL | Status: AC
  Administered 2020-05-07: 15 mL via OROMUCOSAL

## 2020-05-07 MED ORDER — ROCURONIUM BROMIDE 10 MG/ML (PF) SYRINGE
PREFILLED_SYRINGE | INTRAVENOUS | Status: AC
  Filled 2020-05-07: qty 10

## 2020-05-07 MED ORDER — PROPOFOL 500 MG/50ML IV EMUL
INTRAVENOUS | Status: AC
  Filled 2020-05-07: qty 50

## 2020-05-07 MED ORDER — ACETAMINOPHEN 10 MG/ML IV SOLN
INTRAVENOUS | Status: AC
  Filled 2020-05-07: qty 100

## 2020-05-07 MED ORDER — ORAL CARE MOUTH RINSE: 15.0000 mL | Freq: Once | OROMUCOSAL | Status: AC

## 2020-05-07 MED ORDER — IODIXANOL 320 MG/ML IV SOLN: 50.0000 mL | Freq: Once | INTRAVENOUS | Status: DC | PRN

## 2020-05-07 MED ORDER — OXYCODONE HCL 5 MG PO TABS
ORAL_TABLET | ORAL | Status: AC
  Administered 2020-05-07: 5 mg via ORAL
  Filled 2020-05-07: qty 1

## 2020-05-07 MED ORDER — OXYCODONE HCL 5 MG PO TABS: 5.0000 mg | ORAL_TABLET | Freq: Once | ORAL | Status: AC | PRN

## 2020-05-07 MED ORDER — DEXMEDETOMIDINE (PRECEDEX) IN NS 20 MCG/5ML (4 MCG/ML) IV SYRINGE
PREFILLED_SYRINGE | INTRAVENOUS | Status: AC
  Filled 2020-05-07: qty 5

## 2020-05-07 MED ORDER — ACETAMINOPHEN 10 MG/ML IV SOLN
INTRAVENOUS | Status: DC | PRN
  Administered 2020-05-07: 1000 mg via INTRAVENOUS

## 2020-05-07 MED ORDER — ONDANSETRON HCL 4 MG/2ML IJ SOLN: 4.0000 mg | Freq: Once | INTRAMUSCULAR | Status: DC | PRN

## 2020-05-07 MED ORDER — MIDAZOLAM HCL 2 MG/2ML IJ SOLN
INTRAMUSCULAR | Status: AC | PRN
  Administered 2020-05-07 (×2): 1 mg via INTRAVENOUS

## 2020-05-07 MED ORDER — FENTANYL CITRATE (PF) 100 MCG/2ML IJ SOLN
INTRAMUSCULAR | Status: AC
  Administered 2020-05-07: 25 ug via INTRAVENOUS
  Filled 2020-05-07: qty 2

## 2020-05-07 MED ORDER — GEMFIBROZIL 600 MG PO TABS
600.0000 mg | ORAL_TABLET | Freq: Two times a day (BID) | ORAL | Status: DC
  Administered 2020-05-07 – 2020-05-09 (×4): 600 mg via ORAL
  Filled 2020-05-07 (×5): qty 1

## 2020-05-07 MED ORDER — HYDROMORPHONE HCL 1 MG/ML IJ SOLN
0.5000 mg | INTRAMUSCULAR | Status: DC | PRN
  Administered 2020-05-07 – 2020-05-09 (×4): 1 mg via INTRAVENOUS
  Filled 2020-05-07 (×4): qty 1

## 2020-05-07 MED ORDER — DEXAMETHASONE SODIUM PHOSPHATE 10 MG/ML IJ SOLN
INTRAMUSCULAR | Status: AC
  Filled 2020-05-07: qty 1

## 2020-05-07 MED ORDER — CEFAZOLIN SODIUM-DEXTROSE 2-4 GM/100ML-% IV SOLN
INTRAVENOUS | Status: AC
  Filled 2020-05-07: qty 100

## 2020-05-07 MED ORDER — FENTANYL CITRATE (PF) 100 MCG/2ML IJ SOLN
25.0000 ug | INTRAMUSCULAR | Status: DC | PRN
  Administered 2020-05-07: 25 ug via INTRAVENOUS
  Administered 2020-05-07: 50 ug via INTRAVENOUS
  Administered 2020-05-07: 25 ug via INTRAVENOUS

## 2020-05-07 SURGICAL SUPPLY — 69 items
ADAPTER IRRIG TUBE 2 SPIKE SOL (ADAPTER) ×4 IMPLANT
ADAPTER SCOPE UROLOK II (MISCELLANEOUS) IMPLANT
BAG URINE DRAIN 2000ML AR STRL (UROLOGICAL SUPPLIES) ×2 IMPLANT
BALLN NEPHROSTOMY 10X15 (UROLOGICAL SUPPLIES) ×2 IMPLANT
BASKET ZERO TIP 1.9FR (BASKET) ×2 IMPLANT
BLADE SURG 15 STRL LF DISP TIS (BLADE) ×1 IMPLANT
BLADE SURG 15 STRL SS (BLADE) ×1
CATH COUNCIL 22FR (CATHETERS) IMPLANT
CATH FOLEY 2W COUNCIL 20FR 5CC (CATHETERS) ×2 IMPLANT
CATH FOLEY 2W COUNCIL 5CC 18FR (CATHETERS) IMPLANT
CATH FOLEY 2WAY 18X30 (CATHETERS) ×1 IMPLANT
CATH FOLEY 2WAY SIL 18X30 (CATHETERS) ×2
CATH STENT KAYE NEPHR TAMP (CATHETERS) IMPLANT
CATH TORCON 5FR 0.38 (CATHETERS) IMPLANT
CATH URET FLEX-TIP 2 LUMEN 10F (CATHETERS) IMPLANT
CATH URETL 5X70 OPEN END (CATHETERS) ×2 IMPLANT
CATH/STENT KAYE NEPHR TAMP (CATHETERS)
CHLORAPREP W/TINT 26 (MISCELLANEOUS) ×2 IMPLANT
CNTNR SPEC 2.5X3XGRAD LEK (MISCELLANEOUS) ×1
CONRAY 43 FOR UROLOGY 50M (MISCELLANEOUS) ×8 IMPLANT
CONT SPEC 4OZ STER OR WHT (MISCELLANEOUS) ×1
CONTAINER SPEC 2.5X3XGRAD LEK (MISCELLANEOUS) ×1 IMPLANT
DRAPE 3/4 80X56 (DRAPES) ×2 IMPLANT
DRAPE C-ARM XRAY 36X54 (DRAPES) ×2 IMPLANT
GAUZE SPONGE 4X4 12PLY STRL (GAUZE/BANDAGES/DRESSINGS) ×2 IMPLANT
GLIDEWIRE STIFF .35X180X3 HYDR (WIRE) IMPLANT
GLOVE SURG UNDER POLY LF SZ7.5 (GLOVE) ×2 IMPLANT
GOWN STRL REUS W/ TWL LRG LVL3 (GOWN DISPOSABLE) ×1 IMPLANT
GOWN STRL REUS W/ TWL XL LVL3 (GOWN DISPOSABLE) ×1 IMPLANT
GOWN STRL REUS W/TWL LRG LVL3 (GOWN DISPOSABLE) ×1
GOWN STRL REUS W/TWL XL LVL3 (GOWN DISPOSABLE) ×1
GUIDEWIRE GREEN .038 145CM (MISCELLANEOUS) IMPLANT
GUIDEWIRE INTRO SET STRAIGHT (WIRE) ×2 IMPLANT
GUIDEWIRE STR DUAL SENSOR (WIRE) ×2 IMPLANT
GUIDEWIRE STR ZIPWIRE 035X150 (MISCELLANEOUS) IMPLANT
GUIDEWIRE SUPER STIFF (WIRE) ×4 IMPLANT
HOLDER FOLEY CATH W/STRAP (MISCELLANEOUS) ×2 IMPLANT
IV NS IRRIG 3000ML ARTHROMATIC (IV SOLUTION) ×16 IMPLANT
KIT PROBE TRILOGY 3.9X350 (MISCELLANEOUS) ×2 IMPLANT
MANIFOLD NEPTUNE II (INSTRUMENTS) ×4 IMPLANT
MAT ABSORB  FLUID 56X50 GRAY (MISCELLANEOUS) ×2
MAT ABSORB FLUID 56X50 GRAY (MISCELLANEOUS) ×2 IMPLANT
NDL FASCIA INCISION 18GA (NEEDLE) ×2 IMPLANT
PACK BASIN MINOR ARMC (MISCELLANEOUS) ×2 IMPLANT
PAD ABD DERMACEA PRESS 5X9 (GAUZE/BANDAGES/DRESSINGS) ×4 IMPLANT
PAD PREP 24X41 OB/GYN DISP (PERSONAL CARE ITEMS) ×2 IMPLANT
SET IRRIGATING DISP (SET/KITS/TRAYS/PACK) ×2 IMPLANT
SHEET NEURO XL SOL CTL (MISCELLANEOUS) ×2 IMPLANT
SPONGE DRAIN TRACH 4X4 STRL 2S (GAUZE/BANDAGES/DRESSINGS) ×2 IMPLANT
STENT URET 6FRX24 CONTOUR (STENTS) IMPLANT
STENT URET 6FRX26 CONTOUR (STENTS) ×2 IMPLANT
STRAP SAFETY 5IN WIDE (MISCELLANEOUS) ×6 IMPLANT
SURGILUBE 2OZ TUBE FLIPTOP (MISCELLANEOUS) ×2 IMPLANT
SUT ETHILON 2 0 FS 18 (SUTURE) ×2 IMPLANT
SUT MNCRL 4-0 (SUTURE) ×1
SUT MNCRL 4-0 27XMFL (SUTURE) ×1
SUTURE MNCRL 4-0 27XMF (SUTURE) ×1 IMPLANT
SYR 10ML LL (SYRINGE) ×2 IMPLANT
SYR 20ML LL LF (SYRINGE) ×2 IMPLANT
SYR 30ML LL (SYRINGE) ×4 IMPLANT
SYR TOOMEY IRRIG 70ML (MISCELLANEOUS) ×2
SYRINGE TOOMEY IRRIG 70ML (MISCELLANEOUS) ×1 IMPLANT
TAPE CLOTH 3X10 WHT NS LF (GAUZE/BANDAGES/DRESSINGS) ×2 IMPLANT
TAPE MICROFOAM 4IN (TAPE) ×2 IMPLANT
TRAP SPECIMEN MUCUS 40CC (MISCELLANEOUS) ×2 IMPLANT
TRAY FOLEY MTR SLVR 16FR STAT (SET/KITS/TRAYS/PACK) ×2 IMPLANT
TUBING CONNECTING 10 (TUBING) ×2 IMPLANT
VALVE UROSEAL ADJ ENDO (VALVE) ×2 IMPLANT
WATER STERILE IRR 1000ML POUR (IV SOLUTION) ×2 IMPLANT

## 2020-05-07 NOTE — H&P (Signed)
   05/07/20 10:45 AM   Laura Adams 11-19-1965 [id number]  CC: Left staghorn stone  HPI: 55 year old female with complete left staghorn stone and left proximal ureteral stone, previously stented for UTI and sepsis from urinary source on 04/20/2020.  Here today for definitive management with left percutaneous nephrolithotomy.   PMH: Past Medical History:  Diagnosis Date  . GERD (gastroesophageal reflux disease)   . History of kidney stones   . PONV (postoperative nausea and vomiting)     Surgical History: Past Surgical History:  Procedure Laterality Date  . COLONOSCOPY    . CYSTOSCOPY WITH STENT PLACEMENT Left 04/20/2020   Procedure: CYSTOSCOPY WITH STENT PLACEMENT;  Surgeon: Crista Elliot, MD;  Location: [facility] ORS;  Service: Urology;  Laterality: Left;    Family History: Family History  Problem Relation Age of Onset  . Breast cancer Neg Hx     Social History:  reports that she has never smoked. She has never used smokeless tobacco. She reports previous alcohol use. She reports that she does not use drugs.  Physical Exam:  Constitutional:  Alert and oriented, No acute distress. Cardiovascular: Regular rate and rhythm Respiratory: Clear to auscultation bilaterally GI: Abdomen is soft, nontender, nondistended, no abdominal masses  Laboratory Data: Urine culture 05/01/2020 no growth   Assessment & Plan:   55 year old female with a complete left staghorn stone and left proximal ureteral stone, previously stented for UTI, here today for definitive management with left percutaneous nephrolithotomy.  We discussed the risks of bleeding/transfusion, infection/sepsis, post-operative pain, need for additional procedures including ureteroscopy or second look PCNL, possible pleural injury, and damage to surrounding structures.  IR this morning for nephrostomy tube, left PCNL today  Legrand Rams, MD 05/07/2020  [facility] [address] [place], [place] [address] [phone]

## 2020-05-07 NOTE — Anesthesia Procedure Notes (Signed)
Procedure Name: Intubation Date/Time: 05/07/2020 12:59 PM Performed by: Debe Coder, CRNA Pre-anesthesia Checklist: Patient identified, Emergency Drugs available, Suction available and Patient being monitored Patient Re-evaluated:Patient Re-evaluated prior to induction Oxygen Delivery Method: Circle system utilized Preoxygenation: Pre-oxygenation with 100% oxygen Induction Type: IV induction Ventilation: Mask ventilation without difficulty Laryngoscope Size: Mac and 4 Grade View: Grade I Tube type: Oral Tube size: 6.5 mm Number of attempts: 1 Airway Equipment and Method: Stylet and Oral airway Placement Confirmation: ETT inserted through vocal cords under direct vision,  positive ETCO2 and breath sounds checked- equal and bilateral Secured at: 20 cm Tube secured with: Tape Dental Injury: Teeth and Oropharynx as per pre-operative assessment

## 2020-05-07 NOTE — Op Note (Signed)
Date of procedure: 05/07/20  Preoperative diagnosis:  1. Left staghorn stone, ~4cm  Postoperative diagnosis:  1. Same  Procedure: 1. Left percutaneous nephrolithotomy 2. Left antegrade ureteroscopy  Surgeon: Nickolas Madrid, MD  Anesthesia: General  Complications: None  Intraoperative findings:  1.  Uncomplicated fragmentation and extraction of large ~4 cm staghorn stone 2.  Ureter patent down to the bladder on ureteroscopy 3.  Uncomplicated left ureteral stent placement and nephrostomy tube  EBL: Minimal  Specimens: Stone for analysis  Drains: 2 [place] [employer] scientist (life sciences) as nephrostomy tube, 6 [place] by 26 cm left ureteral stent  Indication: Laura Adams is a 55 y.o. patient with left staghorn stone and a proximal ureteral stone who previously underwent emergent stent placement for infection, and presents today for definitive management.  After reviewing the management options for treatment, they elected to proceed with the above surgical procedure(s). We have discussed the potential benefits and risks of the procedure, side effects of the proposed treatment, the likelihood of the patient achieving the goals of the procedure, and any potential problems that might occur during the procedure or recuperation. Informed consent has been obtained.  Description of procedure: A left upper pole nephrostomy tube access has been placed by interventional radiology earlier in the day.  Please see their note for details.  The patient was taken to the operating room and general anesthesia was induced. SCDs were placed for DVT prophylaxis.  Ancef was given for antibiotics.  We started by placing her in the frog-leg position and flexible cystoscopy was performed and the old ureteral stent was removed with graspers intact.  An 87 French two-way Foley was then placed in the bladder and 10 cc placed in the balloon.  The patient was then flipped into the prone position and meticulously padded.   She was prepped and draped in standard sterile fashion, and a drape was applied over the nephrostomy tube access.  I started by advancing a sensor wire through the nephrostomy tube and this was passed down to the bladder under fluoroscopic vision, and the nephrostomy catheter was removed.  A dual-lumen access catheter was advanced over the wire into the proximal ureter, and a second Super Stiff wire was added.  A 1 cm skin incision was made, and the fascial incision device was advanced over the wire in a cruciate fashion.  The 72 French PCNL balloon was advanced over the Super Stiff wire under fluoroscopic vision and inflated to a total of 20 ATM when positioned across the kidney.  Under fluoroscopic vision, the sheath was then carefully advanced over the balloon and into the kidney onto the stone, which could clearly be seen on fluoroscopy.  The nephroscope with Trilogy device was inserted into the kidney and vision was excellent.  There was a large yellow stone that encompassed the entire renal pelvis, as well as the mid and lower pole calyces.  Over about 45 minutes the stone was carefully fragmented and suctioned free using pneumatic and ultrasonic energy.  At this point when I had removed the vast majority the stone a flexible nephroscope was inserted into the kidney and all calyces were inspected.  There were some small residual stone fragments in the midpole, and these were basket extracted.  I then changed over to the single channel digital flexible ureteroscope and this was advanced alongside the wire down the ureter all the way to the bladder.  There were no significant stones seen.  The bladder was normal-appearing.  Careful pullback ureteroscopy showed  just some small debris, which was all basketed and removed from the kidney.  I again inserted the nephroscope with the trilogy device and suctioned free a few small fragments, but no other large stone fragments remained.    The nephroscope was backloaded  over the wire and a 6 [place] by 26 cm ureteral stent was uneventfully placed with an excellent curl in the bladder, as well as under direct vision in the renal pelvis.  Contrast was injected through the scope and drained briskly down the ureter alongside the stent.  The wire was pulled back into the kidney, and the 35 [place] council Foley was placed through the sheath over the wire into the kidney under fluoroscopic vision.  Only 4 cc were placed in the balloon, and this was located in the renal pelvis.  This sheath was removed and pressure was applied.  The Foley was sutured into place and secured.  Urine was light pink.  A dressing with gauze, ABD pads, and foam tape was applied, and both the Foley nephrostomy tube and the urethral Foley were connected to drainage.  Disposition: Stable to PACU  Plan: Anticipate nephrostomy tube removal tomorrow morning, Foley catheter removal in the late afternoon and discharge home Follow-up for KUB in 2 to 3 weeks and stent removal  Nickolas Madrid, MD

## 2020-05-07 NOTE — H&P (Signed)
Chief Complaint: Left sided staghorn calculi. Request is for left sided nephrostomy tube placement.  Referring Physician(s): Laura Adams  Supervising Physician: Laura Adams  Patient Status: [facility] - Out-pt  History of Present Illness: Laura Adams is a 55 y.o. female History of nephrolithiasis. Recent admission for sepsis found to be urinary in source related to a 1 cm and 3 cm left complete staghorn calculi s/p stent placement on 4.8.22. Team is requesting  a left sided nephrostomy tube placement for access for percutaneous nephrolithotomy scheduled for 4.25.22  Currently without any significant complaints. Patient alert and laying in bed, calm and comfortable. Denies any fevers, headache, chest pain, SOB, cough, abdominal pain, nausea, vomiting or bleeding. [name] reports an improvement in her symptoms since stent placement on 4.8.22. Return precautions and treatment recommendations and follow-up discussed with the patient who is agreeable with the plan.    Past Medical History:  Diagnosis Date  . GERD (gastroesophageal reflux disease)   . History of kidney stones   . PONV (postoperative nausea and vomiting)     Past Surgical History:  Procedure Laterality Date  . COLONOSCOPY    . CYSTOSCOPY WITH STENT PLACEMENT Left 04/20/2020   Procedure: CYSTOSCOPY WITH STENT PLACEMENT;  Surgeon: Crista Elliot, MD;  Location: [facility] ORS;  Service: Urology;  Laterality: Left;    Allergies: Patient has no known allergies.  Medications: Prior to Admission medications   Medication Sig Start Date End Date Taking? Authorizing Provider  acetaminophen (TYLENOL) 500 MG tablet Take 1,000 mg by mouth every 6 (six) hours as needed for moderate pain.    [provider]  cetirizine (ZYRTEC) 10 MG tablet Take 10 mg by mouth daily.    [provider]  fluticasone (FLONASE) 50 MCG/ACT nasal spray Place 1 spray into both nostrils daily.    [provider]   gemfibrozil (LOPID) 600 MG tablet Take 600 mg by mouth 2 (two) times daily. 12/26/19   [provider]  Multiple Vitamin (MULTIVITAMIN WITH MINERALS) TABS tablet Take 1 tablet by mouth daily.    [provider]  niacin (NIASPAN) 500 MG CR tablet Take 500 mg by mouth every evening. 12/26/19   [provider]  omeprazole (PRILOSEC) 20 MG capsule Take 20 mg by mouth daily. 12/26/19   [provider]  oxyCODONE (OXY IR/ROXICODONE) 5 MG immediate release tablet Take 5 mg by mouth every 4 (four) hours as needed for severe pain.    [provider]  sulfamethoxazole-trimethoprim (BACTRIM DS) 800-160 MG tablet Take 1 tablet by mouth daily. 05/01/20   Laura Come, MD     Family History  Problem Relation Age of Onset  . Breast cancer Neg Hx     Social History   Socioeconomic History  . Marital status: Married    Spouse name: Not on file  . Number of children: Not on file  . Years of education: Not on file  . Highest education level: Not on file  Occupational History  . Not on file  Tobacco Use  . Smoking status: Never Smoker  . Smokeless tobacco: Never Used  Vaping Use  . Vaping Use: Never used  Substance and Sexual Activity  . Alcohol use: Not Currently  . Drug use: Never  . Sexual activity: Not on file  Other Topics Concern  . Not on file  Social History Narrative  . Not on file   Social Determinants of Health   Financial Resource Strain: Not on file  Food Insecurity: Not on file  Transportation Needs: Not on file  Physical Activity: Not on file  Stress: Not on file  Social Connections: Not on file    Review of Systems: A 12 point ROS discussed and pertinent positives are indicated in the HPI above.  All other systems are negative.  Review of Systems  Constitutional: Negative for fatigue and fever.  HENT: Negative for congestion.   Respiratory: Negative for cough and shortness of breath.   Gastrointestinal: Negative for  abdominal pain, diarrhea, nausea and vomiting.    Vital Signs: There were no vitals taken for this visit.  Physical Exam Vitals and nursing note reviewed.  Constitutional:      Appearance: She is well-developed.  HENT:     Head: Normocephalic and atraumatic.  Eyes:     Conjunctiva/sclera: Conjunctivae normal.  Cardiovascular:     Rate and Rhythm: Normal rate and regular rhythm.     Heart sounds: Normal heart sounds.  Pulmonary:     Effort: Pulmonary effort is normal.     Breath sounds: Normal breath sounds.  Musculoskeletal:        General: Normal range of motion.     Cervical back: Normal range of motion.  Skin:    General: Skin is warm.  Neurological:     Mental Status: She is alert and oriented to person, place, and time.     Imaging: DG Abdomen 1 View  Result Date: 04/20/2020 CLINICAL DATA:  Left lower quadrant abdominal pain. EXAM: ABDOMEN - 1 VIEW COMPARISON:  None. FINDINGS: The bowel gas pattern is normal. Large staghorn type left renal calculus is noted. IMPRESSION: No evidence of bowel obstruction or ileus. Large left renal calculus is noted. Electronically Signed   By: Lupita Raider M.D.   On: 04/20/2020 11:29   DG OR UROLOGY CYSTO IMAGE ([facility] ONLY)  Result Date: 04/20/2020 There is no interpretation for this exam.  This order is for images obtained during a surgical procedure.  Please See "Surgeries" Tab for more information regarding the procedure.   CT Renal Stone Study  Result Date: 04/20/2020 CLINICAL DATA:  Low back pain radiates abdomen. Nausea vomiting. History of kidney stones. EXAM: CT ABDOMEN AND PELVIS WITHOUT CONTRAST TECHNIQUE: Multidetector CT imaging of the abdomen and pelvis was performed following the standard protocol without IV contrast. COMPARISON:  None. FINDINGS: Lower chest: Unremarkable. Hepatobiliary: No focal abnormality in the liver on this study without intravenous contrast. There is no evidence for gallstones, gallbladder wall  thickening, or pericholecystic fluid. No intrahepatic or extrahepatic biliary dilation. Pancreas: No focal mass lesion. No dilatation of the main duct. No intraparenchymal cyst. No peripancreatic edema. Spleen: No splenomegaly. No focal mass lesion. Adrenals/Urinary Tract: No adrenal nodule or mass. 1 mm nonobstructing stone noted upper pole right kidney (best seen on coronal image 95/series 5). No right ureteral stone. No secondary changes in the right kidney or ureter. Large staghorn calculus (approximately 3.3 x 3.0 x 3.6 cm) identified left renal pelvis with mild to moderate left hydronephrosis and perinephric and peripelvic edema/inflammation. Tiny gas bubbles are seen in an interpolar calyx and in the renal pelvis near the UPJ. Additional stones are seen scattered in the left kidney with an obstructing stone at the UPJ measuring 7 x 6 x 11 mm. Left ureter unremarkable. The urinary bladder appears normal for the degree of distention. Stomach/Bowel: Stomach is unremarkable. No gastric wall thickening. No evidence of outlet obstruction. Duodenum is normally positioned as is the ligament of  Treitz. No small bowel wall thickening. No small bowel dilatation. The terminal ileum is normal. The appendix is normal. No gross colonic mass. No colonic wall thickening. Vascular/Lymphatic: No abdominal aortic aneurysm. There is no gastrohepatic or hepatoduodenal ligament lymphadenopathy. Borderline lymphadenopathy noted left para-aortic space with 11 mm short axis node visible on image 33/2. No pelvic sidewall lymphadenopathy. Reproductive: The uterus is unremarkable.  There is no adnexal mass. Other: No intraperitoneal free fluid. Musculoskeletal: Advanced degenerative changes noted right hip No worrisome lytic or sclerotic osseous abnormality. IMPRESSION: 1. 7 x 6 x 11 mm obstructing stone at the left UPJ. Moderate dilatation of the left intrarenal collecting system with tiny gas bubble seen in an interpolar calyx and in  the low renal pelvis, features compatible with pyonephrosis. 2. Very large staghorn calculus identified in the right renal pelvis extending into interpolar calices. Additional scattered stones noted in the left kidney. 3. Advanced degenerative changes right hip. 4. Emphysema (ICD10-J43.9). Electronically Signed   By: Kennith Center M.D.   On: 04/20/2020 13:51    Labs:  CBC: Recent Labs    04/21/20 0516 04/22/20 0443 04/23/20 0429 05/04/20 0817  WBC 36.6* 28.2* 19.8* 9.2  HGB 11.5* 10.6* 10.2* 13.5  HCT 34.0* 32.2* 30.4* 43.5  PLT 394 365 384 569*    COAGS: Recent Labs    05/04/20 0817  INR 1.0    BMP: Recent Labs    04/20/20 1116 04/21/20 0516 04/22/20 0443 05/04/20 0817  NA 137 136 139 138  K 3.2* 3.0* 4.0 3.7  CL 102 104 109 104  CO2 23 23 23 22   GLUCOSE 112* 101* 102* 83  BUN 19 10 9 17   CALCIUM 9.6 8.5* 8.5* 9.9  CREATININE 0.87 0.65 0.60 0.77  GFRNONAA >60 >60 >60 >60    LIVER FUNCTION TESTS: Recent Labs    04/20/20 1116  BILITOT 0.6  AST 28  ALT 31  ALKPHOS 87  PROT 8.0  ALBUMIN 4.0    Assessment and Plan:  55 y.o. female outpatient. History of nephrolithiasis. Recent admission for sepsis found to be urinary in source related to a 1 cm and 3 cm left complete staghorn calculi s/p stent placement on 4.8.22. Team is requesting  a left sided nephrostomy tube placement for access for percutaneous nephrolithotomy scheduled for 4.25.22.  All labs from 4.22.22 and mediations are within acceptable parameters. NKDA. Patient has been NPO since midnight.   Risks and benefits of left sided PCN placement was discussed with the patient including, but not limited to, infection, bleeding, significant bleeding causing loss or decrease in renal function or damage to adjacent structures.   All of the patient's questions were answered, patient is agreeable to proceed.  Consent signed and in chart.   Thank you for this interesting consult.  I greatly enjoyed meeting  Bethzy L Eckhardt and look forward to participating in their care.  A copy of this report was sent to the requesting provider on this date.  Electronically Signed: 09-02-1984, NP 05/07/2020, 11:44 AM   I spent a total of  30 Minutes   in face to face in clinical consultation, greater than 50% of which was counseling/coordinating care for left sided nephrostomy tube placement

## 2020-05-07 NOTE — Procedures (Signed)
Pre Procedure Dx: Hydronephrosis d/t left sided staghorn calculus Post Procedural Dx: Same  Successful [place] and fluoroscopic guided placement of a left sided 5 Fr nephroureteral catheter via requested dilated upper pole calyx traversing pelvic and ureteral stones with tip within the urinary bladder. Nephroureteral catheter capped for impending use in the OR.  EBL: Minimal Complications: Pre-existing ureteral stent displaced to the level of the urinary bladder.   Katherina Right, MD Pager #: [phone]

## 2020-05-07 NOTE — Anesthesia Preprocedure Evaluation (Signed)
Anesthesia Evaluation  Patient identified by MRN, date of birth, ID band Patient awake    Reviewed: Allergy & Precautions, NPO status , Patient's Chart, lab work & pertinent test results  History of Anesthesia Complications (+) PONV and history of anesthetic complications  Airway Mallampati: II  TM Distance: >3 FB Neck ROM: Full    Dental no notable dental hx. (+) Teeth Intact   Pulmonary neg pulmonary ROS, neg sleep apnea, neg COPD, Patient abstained from smoking.Not current smoker,    Pulmonary exam normal breath sounds clear to auscultation       Cardiovascular Exercise Tolerance: Good METS(-) hypertension(-) CAD and (-) Past MI negative cardio ROS  (-) dysrhythmias  Rhythm:Regular Rate:Normal - Systolic murmurs    Neuro/Psych negative neurological ROS  negative psych ROS   GI/Hepatic GERD  Medicated and Controlled,(+)     (-) substance abuse  ,   Endo/Other  neg diabetes  Renal/GU Renal disease     Musculoskeletal   Abdominal   Peds  Hematology   Anesthesia Other Findings Past Medical History: No date: GERD (gastroesophageal reflux disease) No date: History of kidney stones No date: PONV (postoperative nausea and vomiting)  Reproductive/Obstetrics                            Anesthesia Physical Anesthesia Plan  ASA: II  Anesthesia Plan: General   Post-op Pain Management:    Induction: Intravenous  PONV Risk Score and Plan: 4 or greater and Ondansetron, Dexamethasone, Midazolam, Propofol infusion and TIVA  Airway Management Planned: Oral ETT  Additional Equipment: None  Intra-op Plan:   Post-operative Plan: Extubation in OR  Informed Consent: I have reviewed the patients History and Physical, chart, labs and discussed the procedure including the risks, benefits and alternatives for the proposed anesthesia with the patient or authorized representative who has indicated  his/her understanding and acceptance.     Dental advisory given  Plan Discussed with: CRNA and Surgeon  Anesthesia Plan Comments: (Discussed risks of anesthesia with patient, including PONV, sore throat, lip/dental damage. Rare risks discussed as well, such as cardiorespiratory and neurological sequelae. Patient understands.)        Anesthesia Quick Evaluation

## 2020-05-07 NOTE — Anesthesia Postprocedure Evaluation (Signed)
Anesthesia Post Note  Patient: Laura Adams  Procedure(s) Performed: NEPHROLITHOTOMY PERCUTANEOUS (Left )  Patient location during evaluation: PACU Anesthesia Type: General Level of consciousness: awake and alert Pain management: pain level controlled Vital Signs Assessment: post-procedure vital signs reviewed and stable Respiratory status: spontaneous breathing, nonlabored ventilation, respiratory function stable and patient connected to nasal cannula oxygen Cardiovascular status: blood pressure returned to baseline and stable Postop Assessment: no apparent nausea or vomiting Anesthetic complications: no   No complications documented.   Last Vitals:  Vitals:   05/07/20 1540 05/07/20 1545  BP:  139/76  Pulse: 97 99  Resp: 16 16  Temp:  36.8 C  SpO2: 94% 93%    Last Pain:  Vitals:   05/07/20 1545  TempSrc:   PainSc: 7                  Corinda Gubler

## 2020-05-07 NOTE — Progress Notes (Signed)
Patient transferred from OR via bed, no acute distress noted.  AxO x4, denies any pain at this time.  Surgical site accessed, intact.  Nephrostomy Drain intact and draining well. Foley catheter draining well, off floor, below bladder.  Bruises noted to upper extremities.  Left upper hip/side redness/stage 1area noted. SCDs on.  Incentive spirometer given patient educated on purpose and able to verbal understanding as well as demonstrate, 2000 noted on IBS. Will report off to oncoming nurse.

## 2020-05-07 NOTE — Progress Notes (Signed)
Patient clinically stable post PCNL placement per Dr Grace Isaac, tolerated well. Vitals stable pre and post procedure.awake/alert and oriented post procedure. Patient needed now in OR per DR Naomie Dean, consulted with Dr Grace Isaac and gave approval for patient to go now straight to OR, thus gave report to Lissa RN SDS who is taking patient to OR at this time.

## 2020-05-07 NOTE — Transfer of Care (Signed)
Immediate Anesthesia Transfer of Care Note  Patient: Laura Adams  Procedure(s) Performed: NEPHROLITHOTOMY PERCUTANEOUS (Left )  Patient Location: PACU  Anesthesia Type:General  Level of Consciousness: awake, alert  and oriented  Airway & Oxygen Therapy: Patient Spontanous Breathing and Patient connected to face mask oxygen  Post-op Assessment: Report given to RN and Post -op Vital signs reviewed and stable  Post vital signs: Reviewed and stable  Last Vitals:  Vitals Value Taken Time  BP 130/79 05/07/20 1517  Temp 36 C 05/07/20 1517  Pulse 95 05/07/20 1522  Resp 23 05/07/20 1522  SpO2 97 % 05/07/20 1522  Vitals shown include unvalidated device data.  Last Pain:  Vitals:   05/07/20 1048  TempSrc: Oral  PainSc: 4          Complications: No complications documented.

## 2020-05-07 NOTE — Addendum Note (Signed)
Addendum  created 05/07/20 1607 by Reece Agar, CRNA   Flowsheet accepted

## 2020-05-08 ENCOUNTER — Encounter: Payer: Self-pay | Admitting: Urology

## 2020-05-08 DIAGNOSIS — N200 Calculus of kidney: Secondary | ICD-10-CM | POA: Diagnosis not present

## 2020-05-08 LAB — BASIC METABOLIC PANEL
Anion gap: 11 (ref 5–15)
BUN: 8 mg/dL (ref 6–20)
CO2: 22 mmol/L (ref 22–32)
Calcium: 9.3 mg/dL (ref 8.9–10.3)
Chloride: 104 mmol/L (ref 98–111)
Creatinine, Ser: 0.59 mg/dL (ref 0.44–1.00)
GFR, Estimated: >60 mL/min (ref >60)
Glucose, Bld: 110 mg/dL — ABNORMAL HIGH (ref 70–99)
Potassium: 3.7 mmol/L (ref 3.5–5.1)
Sodium: 137 mmol/L (ref 135–145)

## 2020-05-08 LAB — CBC
HCT: 35.1 % — ABNORMAL LOW (ref 36.0–46.0)
Hemoglobin: 11.5 g/dL — ABNORMAL LOW (ref 12.0–15.0)
MCH: 30.7 pg (ref 26.0–34.0)
MCHC: 32.8 g/dL (ref 30.0–36.0)
MCV: 93.6 fL (ref 80.0–100.0)
Platelets: 480 10*3/uL — ABNORMAL HIGH (ref 150–400)
RBC: 3.75 MIL/uL — ABNORMAL LOW (ref 3.87–5.11)
RDW: 13.1 % (ref 11.5–15.5)
WBC: 17 10*3/uL — ABNORMAL HIGH (ref 4.0–10.5)
nRBC: 0 % (ref 0.0–0.2)

## 2020-05-08 MED ORDER — CHLORHEXIDINE GLUCONATE CLOTH 2 % EX PADS
6.0000 | MEDICATED_PAD | Freq: Every day | CUTANEOUS | Status: DC
  Administered 2020-05-08 – 2020-05-09 (×2): 6 via TOPICAL

## 2020-05-08 MED ORDER — TAMSULOSIN HCL 0.4 MG PO CAPS
0.4000 mg | ORAL_CAPSULE | Freq: Every day | ORAL | Status: DC
  Administered 2020-05-08 – 2020-05-09 (×2): 0.4 mg via ORAL
  Filled 2020-05-08 (×2): qty 1

## 2020-05-08 NOTE — Progress Notes (Signed)
Brief Progress Note  Patient reports increased left flank pain after ambulating on the unit this afternoon. She requests to defer discharge until tomorrow citing pain control concerns. Dr. Richardo Hanks and I are in agreement with this plan. Continuing Foley until the morning for maximal bladder decompression with plans for Foley removal and D/C tomorrow. I placed an order for Flomax and notified nursing to start oxybutynin for management of stent discomfort.

## 2020-05-08 NOTE — Plan of Care (Addendum)
Pt Axox4. Calm and cooperative and able to voice her needs. Prn pain meds adm for L pain at surgical site. Pinkish output from nephrostomy tube and adequate output from foley catheter noted. Pt had one bout of nausea, prn Zofran IV adm and effective. Vitals stable. Safety measures in place. Will continue to monitor.  Problem: Education: Goal: Knowledge of General Education information will improve Description: Including pain rating scale, medication(s)/side effects and non-pharmacologic comfort measures Outcome: Progressing   Problem: Health Behavior/Discharge Planning: Goal: Ability to manage health-related needs will improve Outcome: Progressing   Problem: Clinical Measurements: Goal: Ability to maintain clinical measurements within normal limits will improve Outcome: Progressing Goal: Will remain free from infection Outcome: Progressing Goal: Diagnostic test results will improve Outcome: Progressing Goal: Respiratory complications will improve Outcome: Progressing Goal: Cardiovascular complication will be avoided Outcome: Progressing   Problem: Activity: Goal: Risk for activity intolerance will decrease Outcome: Progressing   Problem: Nutrition: Goal: Adequate nutrition will be maintained Outcome: Progressing   Problem: Coping: Goal: Level of anxiety will decrease Outcome: Progressing   Problem: Elimination: Goal: Will not experience complications related to bowel motility Outcome: Progressing Goal: Will not experience complications related to urinary retention Outcome: Progressing   Problem: Pain Managment: Goal: General experience of comfort will improve Outcome: Progressing   Problem: Safety: Goal: Ability to remain free from injury will improve Outcome: Progressing   Problem: Skin Integrity: Goal: Risk for impaired skin integrity will decrease Outcome: Progressing

## 2020-05-08 NOTE — Progress Notes (Signed)
Urology Inpatient Progress Note  Subjective: No acute events overnight.   WBC count up today, 17.0.  Hemoglobin down today, 11.5.  Creatinine down today, 0.59. Left nephrostomy tube in place draining amber urine.  Foley catheter in place draining clear, yellow urine. Patient reports feeling well today.  She had some pain overnight that is now resolved.  Anti-infectives: Anti-infectives (From admission, onward)   Start     Dose/Rate Route Frequency Ordered Stop   05/07/20 1106  ceFAZolin (ANCEF) 2-4 GM/100ML-% IVPB       Note to Pharmacy: Guadalupe Dawn   : cabinet override      05/07/20 1106 05/07/20 1303   05/07/20 0101  ceFAZolin (ANCEF) IVPB 2g/100 mL premix        2 g 200 mL/hr over 30 Minutes Intravenous 30 min pre-op 05/07/20 0101 05/07/20 1302      Current Facility-Administered Medications  Medication Dose Route Frequency Provider Last Rate Last Admin  . acetaminophen (TYLENOL) tablet 1,000 mg  1,000 mg Oral Q6H PRN Sondra Come, MD      . acetaminophen (TYLENOL) tablet 650 mg  650 mg Oral Q4H PRN Sondra Come, MD      . Chlorhexidine Gluconate Cloth 2 % PADS 6 each  6 each Topical Daily Sondra Come, MD      . fluticasone (FLONASE) 50 MCG/ACT nasal spray 1 spray  1 spray Each Nare Daily Sondra Come, MD      . gemfibrozil (LOPID) tablet 600 mg  600 mg Oral BID Sondra Come, MD   600 mg at 05/08/20 0746  . HYDROmorphone (DILAUDID) injection 0.5-1 mg  0.5-1 mg Intravenous Q2H PRN Sondra Come, MD   1 mg at 05/07/20 2111  . loratadine (CLARITIN) tablet 10 mg  10 mg Oral Daily Sondra Come, MD   10 mg at 05/08/20 0745  . niacin (NIASPAN) CR tablet 500 mg  500 mg Oral QPM Sondra Come, MD   500 mg at 05/07/20 2111  . ondansetron (ZOFRAN) injection 4 mg  4 mg Intravenous Q4H PRN Sondra Come, MD   4 mg at 05/08/20 0102  . oxybutynin (DITROPAN) tablet 5 mg  5 mg Oral Q8H PRN Sondra Come, MD   5 mg at 05/07/20 1610  . oxyCODONE (Oxy  IR/ROXICODONE) immediate release tablet 5 mg  5 mg Oral Q4H PRN Sondra Come, MD   5 mg at 05/08/20 0758  . pantoprazole (PROTONIX) EC tablet 40 mg  40 mg Oral Daily Sondra Come, MD   40 mg at 05/08/20 0745  . senna-docusate (Senokot-S) tablet 1 tablet  1 tablet Oral QHS PRN Sondra Come, MD       Objective: Vital signs in last 24 hours: Temp:  [96.8 F (36 C)-98.7 F (37.1 C)] 98.3 F (36.8 C) (04/26 0700) Pulse Rate:  [83-113] 95 (04/26 0700) Resp:  [12-23] 16 (04/26 0700) BP: (122-155)/(57-81) 124/77 (04/26 0700) SpO2:  [93 %-99 %] 94 % (04/26 0700) Weight:  [74.8 kg] 74.8 kg (04/25 1048)  Intake/Output from previous day: 04/25 0701 - 04/26 0700 In: 2140 [P.O.:440; I.V.:1600; IV Piggyback:100] Out: 2860 [Urine:2850; Blood:10] Intake/Output this shift: No intake/output data recorded.  Physical Exam Vitals and nursing note reviewed.  Constitutional:      General: She is not in acute distress.    Appearance: She is not ill-appearing, toxic-appearing or diaphoretic.  HENT:     Head: Normocephalic and atraumatic.  Pulmonary:  Effort: Pulmonary effort is normal. No respiratory distress.  Skin:    General: Skin is warm and dry.  Neurological:     Mental Status: She is alert and oriented to person, place, and time.  Psychiatric:        Mood and Affect: Mood normal.        Behavior: Behavior normal.    Lab Results:  Recent Labs    05/07/20 1125 05/08/20 0528  WBC 8.8 17.0*  HGB 12.6 11.5*  HCT 38.5 35.1*  PLT 498* 480*   BMET Recent Labs    05/08/20 0528  NA 137  K 3.7  CL 104  CO2 22  GLUCOSE 110*  BUN 8  CREATININE 0.59  CALCIUM 9.3   PT/INR Recent Labs    05/07/20 1125  LABPROT 13.1  INR 1.0   Assessment & Plan: 55 year old female POD 1 from left PCNL with Dr. Richardo Hanks for management of a left staghorn stone.  No intraoperative complications.  I remove the left nephrostomy tube at the bedside this morning.  I drained approximately  4 cc of water from the Foley balloon, snipped the securing stitch, and removed the nephrostomy tube in its entirety.  No bleeding noted.  I covered the wound with ABD pads and foam tape and counseled the patient to keep the wound covered for approximately 1 week to allow the tract to heal closed.  She expressed understanding.  I will return to the bedside this afternoon for Foley catheter removal.  Barring any clinical decline, she will be okay for discharge this afternoon on prophylactic Bactrim DS daily with plans for cystoscopy stent removal with KUB prior in 2 weeks.  Carman Ching, PA-C 05/08/2020

## 2020-05-09 DIAGNOSIS — N200 Calculus of kidney: Secondary | ICD-10-CM | POA: Diagnosis not present

## 2020-05-09 MED ORDER — SENNOSIDES-DOCUSATE SODIUM 8.6-50 MG PO TABS: 1.0000 | ORAL_TABLET | Freq: Every evening | ORAL | 0 refills | Status: DC | PRN

## 2020-05-09 MED ORDER — HYDROMORPHONE HCL 2 MG PO TABS: 2.0000 mg | ORAL_TABLET | ORAL | 0 refills | Status: DC | PRN

## 2020-05-09 MED ORDER — TAMSULOSIN HCL 0.4 MG PO CAPS: 0.4000 mg | ORAL_CAPSULE | Freq: Every day | ORAL | 1 refills | Status: DC

## 2020-05-09 MED ORDER — OXYBUTYNIN CHLORIDE 5 MG PO TABS: 5.0000 mg | ORAL_TABLET | Freq: Three times a day (TID) | ORAL | 1 refills | Status: DC | PRN

## 2020-05-09 MED ORDER — SULFAMETHOXAZOLE-TRIMETHOPRIM 800-160 MG PO TABS: 1.0000 | ORAL_TABLET | Freq: Two times a day (BID) | ORAL | 0 refills | Status: DC

## 2020-05-09 MED ORDER — ONDANSETRON 4 MG PO TBDP: 4.0000 mg | ORAL_TABLET | Freq: Three times a day (TID) | ORAL | 0 refills | Status: DC | PRN

## 2020-05-09 NOTE — Discharge Summary (Signed)
Date of admission: 05/07/2020  Date of discharge: 05/09/2020  Admission diagnosis: Left staghorn calculus  Discharge diagnosis:  Left staghorn calculus  Secondary diagnoses:  Patient Active Problem List   Diagnosis Date Noted  . 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 Renal stone 04/21/2020  . Hydronephrosis with obstructing calculus 04/20/2020  . Nephrolithiasis 04/20/2020  . History of hypertension 04/20/2020  . Leukocytosis 04/20/2020  . Sepsis ([place]) 04/20/2020  . Staghorn calculus 04/20/2020    History and Physical: For full details, please see admission history and physical. Briefly, Laura Adams is a 55 y.o. year old patient with a complete left staghorn calculus within her left kidney who underwent PCNL on May 07, 2020.  Nephrostomy tube was removed yesterday morning without complication.  She started to experience some left renal spasms and spent the night for further monitoring and pain control.  Her Foley catheter was removed this morning.    Hospital Course: Patient tolerated the procedure well.  She was then transferred to the floor after an uneventful PACU stay.  Her hospital course was uncomplicated.  On POD#2 she had met discharge criteria: was eating a regular diet, was up and ambulating independently,  pain was well controlled, was voiding without a catheter, and was ready to for discharge.  Physical Exam: Constitutional:  Well nourished. Alert and oriented, No acute distress. HEENT: [place] AT, moist mucus membranes.  Trachea midline Cardiovascular: No clubbing, cyanosis, or edema. Respiratory: Normal respiratory effort, no increased work of breathing. GU: No CVA tenderness.  No bladder fullness or masses.  Dressing for the left nephrostomy insertion site is clean and dry.  Neurologic: Grossly intact, no focal deficits, moving all 4 extremities. Psychiatric: Normal mood and affect.    Laboratory values:  Recent Labs    05/07/20 1125 05/08/20 0528  WBC 8.8 17.0*   HGB 12.6 11.5*  HCT 38.5 35.1*   Recent Labs    05/08/20 0528  NA 137  K 3.7  CL 104  CO2 22  GLUCOSE 110*  BUN 8  CREATININE 0.59  CALCIUM 9.3   Recent Labs    05/07/20 1125  INR 1.0   No results for input(s): LABURIN in the last 72 hours. Results for orders placed or performed during the hospital encounter of 05/04/20  SARS CORONAVIRUS 2 (TAT 6-24 HRS) Nasopharyngeal Nasopharyngeal Swab     Status: None   Collection Time: 05/04/20  9:15 AM   Specimen: Nasopharyngeal Swab  Result Value Ref Range Status   SARS Coronavirus 2 NEGATIVE NEGATIVE Final    Comment: (NOTE) SARS-CoV-2 target nucleic acids are NOT DETECTED.  The SARS-CoV-2 RNA is generally detectable in upper and lower respiratory specimens during the acute phase of infection. Negative results do not preclude SARS-CoV-2 infection, do not rule out co-infections with other pathogens, and should not be used as the sole basis for treatment or other patient management decisions. Negative results must be combined with clinical observations, patient history, and epidemiological information. The expected result is Negative.  Fact Sheet for Patients: SugarRoll.be  Fact Sheet for Healthcare Providers: https://www.woods-mathews.com/  This test is not yet approved or cleared by the [place] FDA and  has been authorized for detection and/or diagnosis of SARS-CoV-2 by FDA under an Emergency Use Authorization (EUA). This EUA will remain  in effect (meaning this test can be used) for the duration of the COVID-19 declaration under Se ction 564(b)(1) of the Act, 21 U.S.C. section 360bbb-3(b)(1), unless the authorization is terminated or revoked sooner.  Performed at [facility], [place] [address] [place], [place] [address]     Disposition: Home  Discharge instruction: The patient was instructed to be ambulatory but told to refrain from heavy lifting, strenuous  activity, or driving.   Discharge medications:  No current facility-administered medications on file prior to encounter.   Current Outpatient Medications on File Prior to Encounter  Medication Sig Dispense Refill  . cetirizine (ZYRTEC) 10 MG tablet Take 10 mg by mouth daily.    . fluticasone (FLONASE) 50 MCG/ACT nasal spray Place 1 spray into both nostrils daily.    Marland Kitchen gemfibrozil (LOPID) 600 MG tablet Take 600 mg by mouth 2 (two) times daily.    . Multiple Vitamin (MULTIVITAMIN WITH MINERALS) TABS tablet Take 1 tablet by mouth daily.    . niacin (NIASPAN) 500 MG CR tablet Take 500 mg by mouth every evening.    Marland Kitchen omeprazole (PRILOSEC) 20 MG capsule Take 20 mg by mouth daily.    Marland Kitchen sulfamethoxazole-trimethoprim (BACTRIM DS) 800-160 MG tablet Take 1 tablet by mouth daily. 14 tablet 0    Followup:    Two weeks with Dr. Diamantina Providence for KUB and cystoscopy with stent removal.

## 2020-05-09 NOTE — Progress Notes (Signed)
Patient awaiting wheelchair for discharge home, accompanied by husband. Patient discharged with prescriptions and given discharge instructions.  Patient verbalized understanding and able to teach back information.  IV site d/ced. Patient discharged with all personal belongings. No acute distress noted.  Care relinquished.

## 2020-05-09 NOTE — Plan of Care (Addendum)
Pt Axox4. Calm and cooperative and able to voice her needs. Pt complained of bladder pain/spasm overnight. Prn pain meds adm as well as Ditropan. Pt encouraged to continue using her IS and to ambulate/seat up. Vitals stable. Safety measures in place. Will continue to monitor.  Problem: Education: Goal: Knowledge of General Education information will improve Description: Including pain rating scale, medication(s)/side effects and non-pharmacologic comfort measures Outcome: Progressing   Problem: Health Behavior/Discharge Planning: Goal: Ability to manage health-related needs will improve Outcome: Progressing   Problem: Clinical Measurements: Goal: Ability to maintain clinical measurements within normal limits will improve Outcome: Progressing Goal: Will remain free from infection Outcome: Progressing Goal: Diagnostic test results will improve Outcome: Progressing Goal: Respiratory complications will improve Outcome: Progressing Goal: Cardiovascular complication will be avoided Outcome: Progressing   Problem: Activity: Goal: Risk for activity intolerance will decrease Outcome: Progressing   Problem: Nutrition: Goal: Adequate nutrition will be maintained Outcome: Progressing   Problem: Coping: Goal: Level of anxiety will decrease Outcome: Progressing   Problem: Elimination: Goal: Will not experience complications related to bowel motility Outcome: Progressing Goal: Will not experience complications related to urinary retention Outcome: Progressing   Problem: Pain Managment: Goal: General experience of comfort will improve Outcome: Progressing   Problem: Safety: Goal: Ability to remain free from injury will improve Outcome: Progressing   Problem: Skin Integrity: Goal: Risk for impaired skin integrity will decrease Outcome: Progressing

## 2020-05-09 NOTE — Telephone Encounter (Signed)
LMOM FOR PT TO CALL OFFICE TO SCHEDULE APPT ?

## 2020-05-14 LAB — CALCULI, WITH PHOTOGRAPH (CLINICAL LAB)
Calcium Oxalate Monohydrate: 20 %
Hydroxyapatite: 80 %
Weight Calculi: 7890 mg

## 2020-05-21 ENCOUNTER — Other Ambulatory Visit: Payer: Self-pay

## 2020-05-21 ENCOUNTER — Ambulatory Visit
Admission: RE | Admit: 2020-05-21 | Discharge: 2020-05-21 | Disposition: A | Discharge status: Home / Self Care | Payer: Managed Care, Other (non HMO) | Source: Ambulatory Visit | Attending: Urology | Admitting: Urology

## 2020-05-21 ENCOUNTER — Encounter: Payer: Self-pay | Admitting: Urology

## 2020-05-21 ENCOUNTER — Telehealth: Payer: Self-pay | Admitting: Urology

## 2020-05-21 ENCOUNTER — Ambulatory Visit
Admission: RE | Admit: 2020-05-21 | Discharge: 2020-05-21 | Disposition: A | Discharge status: Home / Self Care | Payer: Managed Care, Other (non HMO) | Attending: Urology | Admitting: Urology

## 2020-05-21 ENCOUNTER — Ambulatory Visit (INDEPENDENT_AMBULATORY_CARE_PROVIDER_SITE_OTHER): Payer: Managed Care, Other (non HMO) | Admitting: Urology

## 2020-05-21 VITALS — BP 155/79 | HR 94 | Ht 67.0 in | Wt 162.0 lb

## 2020-05-21 DIAGNOSIS — N200 Calculus of kidney: Secondary | ICD-10-CM | POA: Diagnosis present

## 2020-05-21 DIAGNOSIS — Z466 Encounter for fitting and adjustment of urinary device: Secondary | ICD-10-CM

## 2020-05-21 MED ORDER — LIDOCAINE HCL URETHRAL/MUCOSAL 2 % EX GEL
1.0000 "application " | Freq: Once | CUTANEOUS | Status: AC
  Administered 2020-05-21: 1 via URETHRAL

## 2020-05-21 NOTE — Patient Instructions (Addendum)
[facility] Instructions [facility]   You will receive a box/kit in the mail that will have a urine jug and instructions in the kit.  When the box arrives you will need to call our office [phone] to schedule a LAB appointment.   You will need to do a 24hour urine and this should be done during the days that our office will be open.  For example any day from Sunday through Thursday.   If you take Vitamin C 100mg or greater please stop this 5 days prior to collection.   How to collect the urine sample: On the day you start the urine sample this 1st morning urine should NOT be collected.  For the rest of the day including all night urines should be collected.  On the next morning the 1st urine should be collected and then you will be finished with the urine collections.   You will need to bring the box with you on your LAB appointment day after urine has been collected and all instructions are complete in the box.  Your blood will be drawn and the box will be collected by our Lab employee to be sent off for analysis.   When urine and blood is complete you will need to schedule a follow up appointment for lab results.  Dietary Guidelines to Help Prevent Kidney Stones Kidney stones are deposits of minerals and salts that form inside your kidneys. Your risk of developing kidney stones may be greater depending on your diet, your lifestyle, the medicines you take, and whether you have certain medical conditions. Most people can lower their chances of developing kidney stones by following the instructions below. Your dietitian may give you more specific instructions depending on your overall health and the type of kidney stones you tend to develop. What are tips for following this plan? Reading food labels  Choose foods with "no salt added" or "low-salt" labels. Limit your salt (sodium) intake to less than 1,500 mg a day.  Choose foods with calcium for each meal and snack. Try  to eat about 300 mg of calcium at each meal. Foods that contain 200-500 mg of calcium a serving include: ? 8 oz (237 mL) of milk, calcium-fortifiednon-dairy milk, and calcium-fortifiedfruit juice. Calcium-fortified means that calcium has been added to these drinks. ? 8 oz (237 mL) of kefir, yogurt, and soy yogurt. ? 4 oz (114 g) of tofu. ? 1 oz (28 g) of cheese. ? 1 cup (150 g) of dried figs. ? 1 cup (91 g) of cooked broccoli. ? One 3 oz (85 g) can of sardines or mackerel. Most people need 1,000-1,500 mg of calcium a day. Talk to your dietitian about how much calcium is recommended for you.   Shopping  Buy plenty of fresh fruits and vegetables. Most people do not need to avoid fruits and vegetables, even if these foods contain nutrients that may contribute to kidney stones.  When shopping for convenience foods, choose: ? Whole pieces of fruit. ? Pre-made salads with dressing on the side. ? Low-fat fruit and yogurt smoothies.  Avoid buying frozen meals or prepared deli foods. These can be high in sodium.  Look for foods with live cultures, such as yogurt and kefir.  Choose high-fiber grains, such as whole-wheat breads, oat bran, and wheat cereals. Cooking  Do not add salt to food when cooking. Place a salt shaker on the table and allow each person to add his or her own salt to taste.    Use vegetable protein, such as beans, textured vegetable protein (TVP), or tofu, instead of meat in pasta, casseroles, and soups. Meal planning  Eat less salt, if told by your dietitian. To do this: ? Avoid eating processed or pre-made food. ? Avoid eating fast food.  Eat less animal protein, including cheese, meat, poultry, or fish, if told by your dietitian. To do this: ? Limit the number of times you have meat, poultry, fish, or cheese each week. Eat a diet free of meat at least 2 days a week. ? Eat only one serving each day of meat, poultry, fish, or seafood. ? When you prepare animal protein,  cut pieces into small portion sizes. For most meat and fish, one serving is about the size of the palm of your hand.  Eat at least five servings of fresh fruits and vegetables each day. To do this: ? Keep fruits and vegetables on hand for snacks. ? Eat one piece of fruit or a handful of berries with breakfast. ? Have a salad and fruit at lunch. ? Have two kinds of vegetables at dinner.  Limit foods that are high in a substance called oxalate. These include: ? Spinach (cooked), rhubarb, beets, sweet potatoes, and Swiss chard. ? Peanuts. ? Potato chips, french fries, and baked potatoes with skin on. ? Nuts and nut products. ? Chocolate.  If you regularly take a diuretic medicine, make sure to eat at least 1 or 2 servings of fruits or vegetables that are high in potassium each day. These include: ? Avocado. ? Banana. ? Orange, prune, carrot, or tomato juice. ? Baked potato. ? Cabbage. ? Beans and split peas. Lifestyle  Drink enough fluid to keep your urine pale yellow. This is the most important thing you can do. Spread your fluid intake throughout the day.  If you drink alcohol: ? Limit how much you use to:  0-1 drink a day for women who are not pregnant.  0-2 drinks a day for men. ? Be aware of how much alcohol is in your drink. In the [place] one drink equals one 12 oz bottle of beer (355 mL), one 5 oz glass of wine (148 mL), or one 1 oz glass of hard liquor (44 mL).  Lose weight if told by your health care provider. Work with your dietitian to find an eating plan and weight loss strategies that work best for you.   General information  Talk to your health care provider and dietitian about taking daily supplements. You may be told the following depending on your health and the cause of your kidney stones: ? Not to take supplements with vitamin C. ? To take a calcium supplement. ? To take a daily probiotic supplement. ? To take other supplements such as magnesium, fish oil, or  vitamin B6.  Take over-the-counter and prescription medicines only as told by your health care provider. These include supplements. What foods should I limit? Limit your intake of the following foods, or eat them as told by your dietitian. Vegetables Spinach. Rhubarb. Beets. Canned vegetables. Pickles. Olives. Baked potatoes with skin. Grains Wheat bran. Baked goods. Salted crackers. Cereals high in sugar. Meats and other proteins Nuts. Nut butters. Large portions of meat, poultry, or fish. Salted, precooked, or cured meats, such as sausages, meat loaves, and hot dogs. Dairy Cheese. Beverages Regular soft drinks. Regular vegetable juice. Seasonings and condiments Seasoning blends with salt. Salad dressings. Soy sauce. Ketchup. Barbecue sauce. Other foods Canned soups. Canned pasta sauce. Casseroles. Pizza.   Lasagna. Frozen meals. Potato chips. French fries. The items listed above may not be a complete list of foods and beverages you should limit. Contact a dietitian for more information. What foods should I avoid? Talk to your dietitian about specific foods you should avoid based on the type of kidney stones you have and your overall health. Fruits Grapefruit. The item listed above may not be a complete list of foods and beverages you should avoid. Contact a dietitian for more information. Summary  Kidney stones are deposits of minerals and salts that form inside your kidneys.  You can lower your risk of kidney stones by making changes to your diet.  The most important thing you can do is drink enough fluid. Drink enough fluid to keep your urine pale yellow.  Talk to your dietitian about how much calcium you should have each day, and eat less salt and animal protein as told by your dietitian. This information is not intended to replace advice given to you by your health care provider. Make sure you discuss any questions you have with your health care provider. Document Revised:  12/23/2018 Document Reviewed: 12/23/2018 Elsevier Patient Education  2021 Elsevier Inc.  

## 2020-05-21 NOTE — Progress Notes (Signed)
Cystoscopy Procedure Note:  Indication: Stent removal s/p left PCNL for staghorn stone 05/07/2020  Stone analysis showed 80% calcium phosphate, 20% calcium oxalate  After informed consent and discussion of the procedure and its risks, Laura Adams was positioned and prepped in the standard fashion. Cystoscopy was performed with a flexible cystoscope. The stent was grasped with flexible graspers and removed in its entirety. The patient tolerated the procedure well.  Findings: Uncomplicated stent removal  I personally reviewed the KUB today that shows no obvious residual stone fragments  Assessment and Plan: Follow-up with 24-hour urine test in 4 to 6 weeks  Sondra Come, MD 05/21/2020

## 2020-05-21 NOTE — Telephone Encounter (Signed)
Pt. Need to know should she continue to take Flomax that was prescribed to her by Dr. Richardo Hanks.  Please call pt.

## 2020-05-22 LAB — URINALYSIS, COMPLETE
Bilirubin, UA: NEGATIVE
Glucose, UA: NEGATIVE
Ketones, UA: NEGATIVE
Nitrite, UA: NEGATIVE
Protein,UA: NEGATIVE
Specific Gravity, UA: <1.005 — ABNORMAL LOW (ref 1.005–1.030)
Urobilinogen, Ur: 0.2 mg/dL (ref 0.2–1.0)
pH, UA: 6 (ref 5.0–7.5)

## 2020-05-22 LAB — MICROSCOPIC EXAMINATION: Bacteria, UA: NONE SEEN

## 2020-05-31 ENCOUNTER — Other Ambulatory Visit: Payer: Self-pay | Admitting: Urology

## 2020-06-01 ENCOUNTER — Other Ambulatory Visit: Payer: Managed Care, Other (non HMO)

## 2020-06-01 ENCOUNTER — Other Ambulatory Visit: Payer: Self-pay

## 2020-06-12 ENCOUNTER — Other Ambulatory Visit: Payer: Self-pay | Admitting: Urology

## 2020-07-05 ENCOUNTER — Encounter: Payer: Self-pay | Admitting: Urology

## 2020-07-05 ENCOUNTER — Ambulatory Visit (INDEPENDENT_AMBULATORY_CARE_PROVIDER_SITE_OTHER): Payer: Managed Care, Other (non HMO) | Admitting: Urology

## 2020-07-05 ENCOUNTER — Other Ambulatory Visit: Payer: Self-pay

## 2020-07-05 VITALS — BP 146/77 | HR 82 | Ht 67.0 in | Wt 162.0 lb

## 2020-07-05 DIAGNOSIS — L02411 Cutaneous abscess of right axilla: Secondary | ICD-10-CM

## 2020-07-05 DIAGNOSIS — N200 Calculus of kidney: Secondary | ICD-10-CM

## 2020-07-05 MED ORDER — CEPHALEXIN 500 MG PO CAPS: 500.0000 mg | ORAL_CAPSULE | Freq: Two times a day (BID) | ORAL | 0 refills | Status: DC

## 2020-07-05 NOTE — Patient Instructions (Addendum)
Continue to drink plenty of fluids, this is the most important strategy to prevent recurrent kidney stones.  You can add lemon juice, lemonade, or Crystal light lemonade to the water to add citrate which can also prevent stones.  Work on decreasing salt in M.[employer] Holdingsyour diet, look at food labels as many diet or flavored drinks can have lots of sodium.  Dietary Guidelines to Help Prevent Kidney Stones Kidney stones are deposits of minerals and salts that form inside your kidneys. Your risk of developing kidney stones may be greater depending on your diet, your lifestyle, the medicines you take, and whether you have certain medical conditions. Most people can lower their chances of developing kidney stones by following the instructions below. Your dietitian may give you more specific instructions depending on your overall health and the type of kidney stones youtend to develop. What are tips for following this plan? Reading food labels  Choose foods with "no salt added" or "low-salt" labels. Limit your salt (sodium) intake to less than 1,500 mg a day. Choose foods with calcium for each meal and snack. Try to eat about 300 mg of calcium at each meal. Foods that contain 200-500 mg of calcium a serving include: 8 oz (237 mL) of milk, calcium-fortifiednon-dairy milk, and calcium-fortifiedfruit juice. Calcium-fortified means that calcium has been added to these drinks. 8 oz (237 mL) of kefir, yogurt, and soy yogurt. 4 oz (114 g) of tofu. 1 oz (28 g) of cheese. 1 cup (150 g) of dried figs. 1 cup (91 g) of cooked broccoli. One 3 oz (85 g) can of sardines or mackerel. Most people need 1,000-1,500 mg of calcium a day. Talk to your dietitian abouthow much calcium is recommended for you. Shopping Buy plenty of fresh fruits and vegetables. Most people do not need to avoid fruits and vegetables, even if these foods contain nutrients that may contribute to kidney stones. When shopping for convenience foods, choose: Whole  pieces of fruit. Pre-made salads with dressing on the side. Low-fat fruit and yogurt smoothies. Avoid buying frozen meals or prepared deli foods. These can be high in sodium. Look for foods with live cultures, such as yogurt and kefir. Choose high-fiber grains, such as whole-wheat breads, oat bran, and wheat cereals. Cooking Do not add salt to food when cooking. Place a salt shaker on the table and allow each person to add his or her own salt to taste. Use vegetable protein, such as beans, textured vegetable protein (TVP), or tofu, instead of meat in pasta, casseroles, and soups. Meal planning Eat less salt, if told by your dietitian. To do this: Avoid eating processed or pre-made food. Avoid eating fast food. Eat less animal protein, including cheese, meat, poultry, or fish, if told by your dietitian. To do this: Limit the number of times you have meat, poultry, fish, or cheese each week. Eat a diet free of meat at least 2 days a week. Eat only one serving each day of meat, poultry, fish, or seafood. When you prepare animal protein, cut pieces into small portion sizes. For most meat and fish, one serving is about the size of the palm of your hand. Eat at least five servings of fresh fruits and vegetables each day. To do this: Keep fruits and vegetables on hand for snacks. Eat one piece of fruit or a handful of berries with breakfast. Have a salad and fruit at lunch. Have two kinds of vegetables at dinner. Limit foods that are high in a substance called oxalate.  These include: Spinach (cooked), rhubarb, beets, sweet potatoes, and Swiss chard. Peanuts. Potato chips, french fries, and baked potatoes with skin on. Nuts and nut products. Chocolate. If you regularly take a diuretic medicine, make sure to eat at least 1 or 2 servings of fruits or vegetables that are high in potassium each day. These include: Avocado. Banana. Orange, prune, carrot, or tomato juice. Baked  potato. Cabbage. Beans and split peas. Lifestyle  Drink enough fluid to keep your urine pale yellow. This is the most important thing you can do. Spread your fluid intake throughout the day. If you drink alcohol: Limit how much you use to: 0-1 drink a day for women who are not pregnant. 0-2 drinks a day for men. Be aware of how much alcohol is in your drink. In the [place] one drink equals one 12 oz bottle of beer (355 mL), one 5 oz glass of wine (148 mL), or one 1 oz glass of hard liquor (44 mL). Lose weight if told by your health care provider. Work with your dietitian to find an eating plan and weight loss strategies that work best for you.  General information Talk to your health care provider and dietitian about taking daily supplements. You may be told the following depending on your health and the cause of your kidney stones: Not to take supplements with vitamin C. To take a calcium supplement. To take a daily probiotic supplement. To take other supplements such as magnesium, fish oil, or vitamin B6. Take over-the-counter and prescription medicines only as told by your health care provider. These include supplements. What foods should I limit? Limit your intake of the following foods, or eat them as told by your dietitian. Vegetables Spinach. Rhubarb. Beets. Canned vegetables. Rosita Fire. Olives. Baked potatoeswith skin. Grains Wheat bran. Baked goods. Salted crackers. Cereals high in sugar. Meats and other proteins Nuts. Nut butters. Large portions of meat, poultry, or fish. Salted, precooked,or cured meats, such as sausages, meat loaves, and hot dogs. Dairy Cheese. Beverages Regular soft drinks. Regular vegetable juice. Seasonings and condiments Seasoning blends with salt. Salad dressings. Soy sauce. Ketchup. Barbecue sauce. Other foods Canned soups. Canned pasta sauce. Casseroles. Pizza. Lasagna. Frozen meals.Potato chips. [place] fries. The items listed above may not be a  complete list of foods and beverages you should limit. Contact a dietitian for more information. What foods should I avoid? Talk to your dietitian about specific foods you should avoid based on the typeof kidney stones you have and your overall health. Fruits Grapefruit. The item listed above may not be a complete list of foods and beverages you should avoid. Contact a dietitian for more information. Summary Kidney stones are deposits of minerals and salts that form inside your kidneys. You can lower your risk of kidney stones by making changes to your diet. The most important thing you can do is drink enough fluid. Drink enough fluid to keep your urine pale yellow. Talk to your dietitian about how much calcium you should have each day, and eat less salt and animal protein as told by your dietitian. This information is not intended to replace advice given to you by your health care provider. Make sure you discuss any questions you have with your healthcare provider. Document Revised: 12/23/2018 Document Reviewed: 12/23/2018 Elsevier Patient Education  2022 [employer] Inc[employer]   Low-Sodium Eating Plan Sodium, which is an element that makes up salt, helps you maintain a healthy balance of fluids in your body. Too much sodium can increase your  bloodpressure and cause fluid and waste to be held in your body. Your health care provider or dietitian may recommend following this plan if you have high blood pressure (hypertension), kidney disease, liver disease, or heart failure. Eating less sodium can help lower your blood pressure, reduce swelling, and protect your heart, liver, andkidneys. What are tips for following this plan? Reading food labels The Nutrition Facts label lists the amount of sodium in one serving of the food. If you eat more than one serving, you must multiply the listed amount of sodium by the number of servings. Choose foods with less than 140 mg of sodium per serving. Avoid foods  with 300 mg of sodium or more per serving. Shopping  Look for lower-sodium products, often labeled as "low-sodium" or "no salt added." Always check the sodium content, even if foods are labeled as "unsalted" or "no salt added." Buy fresh foods. Avoid canned foods and pre-made or frozen meals. Avoid canned, cured, or processed meats. Buy breads that have less than 80 mg of sodium per slice.  Cooking  Eat more home-cooked food and less restaurant, buffet, and fast food. Avoid adding salt when cooking. Use salt-free seasonings or herbs instead of table salt or sea salt. Check with your health care provider or pharmacist before using salt substitutes. Cook with plant-based oils, such as canola, sunflower, or olive oil.  Meal planning When eating at a restaurant, ask that your food be prepared with less salt or no salt, if possible. Avoid dishes labeled as brined, pickled, cured, smoked, or made with soy sauce, miso, or teriyaki sauce. Avoid foods that contain MSG (monosodium glutamate). MSG is sometimes added to [place] food, bouillon, and some canned foods. Make meals that can be grilled, baked, poached, roasted, or steamed. These are generally made with less sodium. General information Most people on this plan should limit their sodium intake to 1,500-2,000 mg (milligrams) of sodium each day. What foods should I eat? Fruits Fresh, frozen, or canned fruit. Fruit juice. Vegetables Fresh or frozen vegetables. "No salt added" canned vegetables. "No salt added"tomato sauce and paste. Low-sodium or reduced-sodium tomato and vegetable juice. Grains Low-sodium cereals, including oats, puffed wheat and rice, and shredded wheat. Low-sodium crackers. Unsalted rice. Unsalted pasta. Low-sodium bread.Whole-grain breads and whole-grain pasta. Meats and other proteins Fresh or frozen (no salt added) meat, poultry, seafood, and fish. Low-sodium canned tuna and salmon. Unsalted nuts. Dried peas, beans, and  lentils withoutadded salt. Unsalted canned beans. Eggs. Unsalted nut butters. Dairy Milk. Soy milk. Cheese that is naturally low in sodium, such as ricotta cheese, fresh mozzarella, or Swiss cheese. Low-sodium or reduced-sodium cheese. Creamcheese. Yogurt. Seasonings and condiments Fresh and dried herbs and spices. Salt-free seasonings. Low-sodium mustard and ketchup. Sodium-free salad dressing. Sodium-free light mayonnaise. Fresh orrefrigerated horseradish. Lemon juice. Vinegar. Other foods Homemade, reduced-sodium, or low-sodium soups. Unsalted popcorn and pretzels.Low-salt or salt-free chips. The items listed above may not be a complete list of foods and beverages you can eat. Contact a dietitian for more information. What foods should I avoid? Vegetables Sauerkraut, pickled vegetables, and relishes. Olives. [place] fries. Onion rings. Regular canned vegetables (not low-sodium or reduced-sodium). Regular canned tomato sauce and paste (not low-sodium or reduced-sodium). Regular tomato and vegetable juice (not low-sodium or reduced-sodium). Frozenvegetables in sauces. Grains Instant hot cereals. Bread stuffing, pancake, and biscuit mixes. Croutons. Seasoned rice or pasta mixes. Noodle soup cups. Boxed or frozen macaroni andcheese. Regular salted crackers. Self-rising flour. Meats and other proteins Meat or fish that  is salted, canned, smoked, spiced, or pickled. Precooked or cured meat, such as sausages or meat loaves. Tomasa Blase. Ham. Pepperoni. Hot dogs. Corned beef. Chipped beef. Salt pork. Jerky. Pickled herring. Anchovies andsardines. Regular canned tuna. Salted nuts. Dairy Processed cheese and cheese spreads. Hard cheeses. Cheese curds. Blue cheese.Feta cheese. String cheese. Regular cottage cheese. Buttermilk. Canned milk. Fats and oils Salted butter. Regular margarine. Ghee. Bacon fat. Seasonings and condiments Onion salt, garlic salt, seasoned salt, table salt, and sea salt. Canned and  packaged gravies. Worcestershire sauce. Tartar sauce. Barbecue sauce. Teriyaki sauce. Soy sauce, including reduced-sodium. Steak sauce. Fish sauce. Oyster sauce. Cocktail sauce. Horseradish that you find on the shelf. Regular ketchup and mustard. Meat flavorings and tenderizers. Bouillon cubes. Hot sauce. Pre-made or packaged marinades. Pre-made or packaged taco seasonings. Relishes.Regular salad dressings. Salsa. Other foods Salted popcorn and pretzels. Corn chips and puffs. Potato and tortilla chips.Canned or dried soups. Pizza. Frozen entrees and pot pies. The items listed above may not be a complete list of foods and beverages you should avoid. Contact a dietitian for more information. Summary Eating less sodium can help lower your blood pressure, reduce swelling, and protect your heart, liver, and kidneys. Most people on this plan should limit their sodium intake to 1,500-2,000 mg (milligrams) of sodium each day. Canned, boxed, and frozen foods are high in sodium. Restaurant foods, fast foods, and pizza are also very high in sodium. You also get sodium by adding salt to food. Try to cook at home, eat more fresh fruits and vegetables, and eat less fast food and canned, processed, or prepared foods. This information is not intended to replace advice given to you by your health care provider. Make sure you discuss any questions you have with your healthcare provider. Document Revised: 02/04/2019 Document Reviewed: 12/01/2018 Elsevier Patient Education  2022 [employer].

## 2020-07-05 NOTE — Progress Notes (Signed)
   07/05/2020 1:33 PM   Laura Adams 20-Nov-1965 [id number]  Reason for visit: Follow up 24-hour urine results, new underarm lesion  HPI: 55 year old female who underwent a left PCNL on 05/07/2020 for a complete staghorn stone.  Stone analysis showed 80% calcium phosphate, 20% calcium oxalate.  She is here today to review her 24-hour urine results.  These were notable for Urine volume of 4.13 L, elevated urine calcium of 237, elevated urine sodium of 180, low urine citrate of 277, pH 5.8.  We discussed the relationship between sodium and calcium, and I recommended a low-salt diet, continuing large volume fluid intake, and increasing citrate containing foods and drinks.  She reports 1 week of an underarm lesion on the right side that is tender.  There is no drainage.  She denies any fevers or chills.  She has had similar abscesses previously that have required antibiotics.  On exam, there is a 1 cm erythematous and tender lesion with some induration, but no obvious drainage.  I offered her drainage in clinic today but she deferred.  I sent in Keflex 500 mg twice daily x5 days for her small skin abscess/ingrown hair, and return precautions discussed extensively.  RTC 1 year KUB   Sondra Come, MD  [facility] [address] [place], [place] [address] [phone]

## 2020-07-11 ENCOUNTER — Telehealth: Payer: Self-pay | Admitting: Urology

## 2020-07-11 NOTE — Telephone Encounter (Signed)
Patient called and said that she ws instructed to call back if the place under her arm did not go away. She has completed to ABX it is better but not completely gone. Is there anything else she needs to do? Please advise the patient Thanks, Marcelino Duster

## 2020-07-11 NOTE — Telephone Encounter (Signed)
Would recommend PCP or urgent care to see if that needs to be drained, thanks  Legrand Rams, MD 07/11/2020

## 2020-07-11 NOTE — Telephone Encounter (Signed)
Called pt no answer. Left detailed message for pt informing him of the information below. Advised pt call back for questions or concerns.  

## 2020-11-16 ENCOUNTER — Other Ambulatory Visit: Payer: Self-pay | Admitting: Family Medicine

## 2020-11-16 DIAGNOSIS — Z1231 Encounter for screening mammogram for malignant neoplasm of breast: Secondary | ICD-10-CM

## 2020-12-31 ENCOUNTER — Ambulatory Visit
Admission: RE | Admit: 2020-12-31 | Discharge: 2020-12-31 | Disposition: A | Discharge status: Home / Self Care | Payer: Managed Care, Other (non HMO) | Source: Ambulatory Visit | Attending: Family Medicine | Admitting: Family Medicine

## 2020-12-31 ENCOUNTER — Other Ambulatory Visit: Payer: Self-pay

## 2020-12-31 DIAGNOSIS — Z1231 Encounter for screening mammogram for malignant neoplasm of breast: Secondary | ICD-10-CM | POA: Diagnosis not present

## 2021-01-03 ENCOUNTER — Other Ambulatory Visit: Payer: Self-pay | Admitting: Family Medicine

## 2021-01-03 DIAGNOSIS — R928 Other abnormal and inconclusive findings on diagnostic imaging of breast: Secondary | ICD-10-CM

## 2021-01-03 DIAGNOSIS — N6489 Other specified disorders of breast: Secondary | ICD-10-CM

## 2021-01-09 ENCOUNTER — Other Ambulatory Visit: Payer: Self-pay

## 2021-01-09 ENCOUNTER — Ambulatory Visit
Admission: RE | Admit: 2021-01-09 | Discharge: 2021-01-09 | Disposition: A | Discharge status: Home / Self Care | Payer: Managed Care, Other (non HMO) | Source: Ambulatory Visit | Attending: Family Medicine | Admitting: Family Medicine

## 2021-01-09 DIAGNOSIS — N6489 Other specified disorders of breast: Secondary | ICD-10-CM | POA: Insufficient documentation

## 2021-01-09 DIAGNOSIS — R928 Other abnormal and inconclusive findings on diagnostic imaging of breast: Secondary | ICD-10-CM | POA: Diagnosis not present

## 2021-01-16 ENCOUNTER — Other Ambulatory Visit: Payer: Managed Care, Other (non HMO)

## 2021-01-16 ENCOUNTER — Ambulatory Visit: Payer: Managed Care, Other (non HMO)

## 2021-04-25 ENCOUNTER — Encounter: Payer: Self-pay | Admitting: Orthopedic Surgery

## 2021-04-25 ENCOUNTER — Other Ambulatory Visit: Payer: Self-pay | Admitting: Orthopedic Surgery

## 2021-04-25 DIAGNOSIS — Z01818 Encounter for other preprocedural examination: Secondary | ICD-10-CM

## 2021-04-25 NOTE — H&P (Signed)
NAME: Laura Adams ?MRN:   [id number] ?DOB:   15-Jan-1965 ? ?   HISTORY AND PHYSICAL ? ?CHIEF COMPLAINT:  right hip pain ? ?HISTORY:   Laura Adams a 56 y.o. female  with right  Hip Pain ?Patient complains of right hip pain. Onset of the symptoms was several years ago. Inciting event: known DJD. The patient reports the hip pain is worse with weight bearing. Associated symptoms: none. Aggravating symptoms include: any weight bearing. Patient has had prior hip problems. Previous visits for this problem: multiple, this is a longstanding diagnosis. Last seen several weeks ago by me. Evaluation to date: plain films, which were abnormal  osteoarthritis . Treatment to date: OTC analgesics, which have been somewhat effective, prescription analgesics, which have been somewhat effective, home exercise program, which has been somewhat effective, and physical therapy, which has been somewhat effective.   Plan for right total hip replacement ? ?PAST MEDICAL HISTORY:   ?Past Medical History:  ?Diagnosis Date  ? GERD (gastroesophageal reflux disease)   ? History of kidney stones   ? Kidney stone   ? PONV (postoperative nausea and vomiting)   ? ? ?PAST SURGICAL HISTORY:   ?Past Surgical History:  ?Procedure Laterality Date  ? COLONOSCOPY    ? CYSTOSCOPY WITH STENT PLACEMENT Left 04/20/2020  ? Procedure: CYSTOSCOPY WITH STENT PLACEMENT;  Surgeon: Lucas Mallow, MD;  Location: [facility] ORS;  Service: Urology;  Laterality: Left;  ? IR NEPHROSTOMY PLACEMENT LEFT  05/07/2020  ? NEPHROLITHOTOMY Left 05/07/2020  ? Procedure: NEPHROLITHOTOMY PERCUTANEOUS;  Surgeon: Billey Co, MD;  Location: [facility] ORS;  Service: Urology;  Laterality: Left;  ? ? ?MEDICATIONS:  (Not in a hospital admission) ? ? ?ALLERGIES:  No Known Allergies ? ?REVIEW OF SYSTEMS:   Negative except HPI ? ?FAMILY HISTORY:   ?Family History  ?Problem Relation Age of Onset  ? Breast cancer Neg Hx   ? ? ?SOCIAL HISTORY:   reports that she has never smoked. She has  never used smokeless tobacco. She reports that she does not currently use alcohol. She reports that she does not use drugs. ? ?PHYSICAL EXAM:  General appearance: alert, cooperative, and no distress ?Neck: no JVD and supple, symmetrical, trachea midline ?Resp: clear to auscultation bilaterally ?Cardio: regular rate and rhythm, S1, S2 normal, no murmur, click, rub or gallop ?GI: soft, non-tender; bowel sounds normal; no masses,  no organomegaly ?Extremities: extremities normal, atraumatic, no cyanosis or edema and Homans sign is negative, no sign of DVT ?Pulses: 2+ and symmetric ?Skin: Skin color, texture, turgor normal. No rashes or lesions ?Neurologic: Alert and oriented X 3, normal strength and tone. Normal symmetric reflexes. Normal coordination and gait  ? ? ?LABORATORY STUDIES: ?No results for input(s): WBC, HGB, HCT, PLT in the last 72 hours. ? No results for input(s): NA, K, CL, CO2, GLUCOSE, BUN, CREATININE, CALCIUM in the last 72 hours. ? ?STUDIES/RESULTS: ? No results found. ? ?ASSESSMENT:  End stage osteoarthritis right hip ?       Active Problems: ?  * No active hospital problems. * ? ?  ?PLAN:  Right Primary Total Hip ? ? ?[name] ?04/25/2021. 9:38 AM ? ? ? ?  ?

## 2021-04-29 ENCOUNTER — Encounter
Admission: RE | Admit: 2021-04-29 | Discharge: 2021-04-29 | Disposition: A | Discharge status: Home / Self Care | Payer: Managed Care, Other (non HMO) | Source: Ambulatory Visit | Attending: Orthopedic Surgery | Admitting: Orthopedic Surgery

## 2021-04-29 ENCOUNTER — Other Ambulatory Visit: Payer: Self-pay

## 2021-04-29 VITALS — BP 157/91 | HR 87 | Temp 97.7°F | Resp 20 | Ht 67.0 in | Wt 173.1 lb

## 2021-04-29 DIAGNOSIS — Z01818 Encounter for other preprocedural examination: Secondary | ICD-10-CM

## 2021-04-29 DIAGNOSIS — Z01812 Encounter for preprocedural laboratory examination: Secondary | ICD-10-CM | POA: Insufficient documentation

## 2021-04-29 LAB — BASIC METABOLIC PANEL
Anion gap: 11 (ref 5–15)
BUN: 13 mg/dL (ref 6–20)
CO2: 22 mmol/L (ref 22–32)
Calcium: 10 mg/dL (ref 8.9–10.3)
Chloride: 106 mmol/L (ref 98–111)
Creatinine, Ser: 0.73 mg/dL (ref 0.44–1.00)
GFR, Estimated: >60 mL/min (ref >60)
Glucose, Bld: 81 mg/dL (ref 70–99)
Potassium: 3.9 mmol/L (ref 3.5–5.1)
Sodium: 139 mmol/L (ref 135–145)

## 2021-04-29 LAB — CBC
HCT: 44.6 % (ref 36.0–46.0)
Hemoglobin: 14.8 g/dL (ref 12.0–15.0)
MCH: 31.3 pg (ref 26.0–34.0)
MCHC: 33.2 g/dL (ref 30.0–36.0)
MCV: 94.3 fL (ref 80.0–100.0)
Platelets: 440 10*3/uL — ABNORMAL HIGH (ref 150–400)
RBC: 4.73 MIL/uL (ref 3.87–5.11)
RDW: 12.2 % (ref 11.5–15.5)
WBC: 11.9 10*3/uL — ABNORMAL HIGH (ref 4.0–10.5)
nRBC: 0 % (ref 0.0–0.2)

## 2021-04-29 LAB — URINALYSIS, ROUTINE W REFLEX MICROSCOPIC
Bilirubin Urine: NEGATIVE
Glucose, UA: NEGATIVE mg/dL
Hgb urine dipstick: NEGATIVE
Ketones, ur: NEGATIVE mg/dL
Leukocytes,Ua: NEGATIVE
Nitrite: NEGATIVE
Protein, ur: NEGATIVE mg/dL
Specific Gravity, Urine: 1.005 (ref 1.005–1.030)
pH: 5 (ref 5.0–8.0)

## 2021-04-29 LAB — TYPE AND SCREEN
ABO/RH(D): O NEG
Antibody Screen: NEGATIVE

## 2021-04-29 NOTE — Patient Instructions (Signed)
Your procedure is scheduled on: 4/26/ 2023  ?Report to the Registration Desk on the 1st floor of the [facility]. ?To find out your arrival time, please call [phone] between 1PM - 3PM on: 05/07/2021  ? ?REMEMBER: ?Instructions that are not followed completely may result in serious medical risk, up to and including death; or upon the discretion of your surgeon and anesthesiologist your surgery may need to be rescheduled. ? ?Do not eat food after midnight the night before surgery.  ?No gum chewing, lozengers or hard candies. ? ?You may however, drink CLEAR liquids up to 2 hours before you are scheduled to arrive for your surgery. Do not drink anything within 2 hours of your scheduled arrival time. ? ?Clear liquids include: ?- water  ?- apple juice without pulp ?- gatorade (not RED colors) ?- black coffee or tea (Do NOT add milk or creamers to the coffee or tea) ?Do NOT drink anything that is not on this list. ? ? ? ?In addition, your doctor has ordered for you to drink the provided  ?Ensure Pre-Surgery Clear Carbohydrate Drink  ?Drinking this carbohydrate drink up to two hours before surgery helps to reduce insulin resistance and improve patient outcomes. Please complete drinking 2 hours prior to scheduled arrival time. ? ?TAKE THESE MEDICATIONS THE MORNING OF SURGERY WITH A SIP OF WATER: ?Celebrex ?Cetirizine ?Gemfibrozil(take one the night before and one on the morning of surgery - helps to prevent nausea after surgery.) ?Omeprazole -  ?5. flomax ? ? ?Follow recommendations from Cardiologist, Pulmonologist, PCP or surgeon regarding stopping Aspirin. ? ?One week prior to surgery: ?Stop Anti-inflammatories (NSAIDS) such as Advil, Aleve, Ibuprofen, Motrin, Naproxen, Naprosyn and Aspirin based products such as Excedrin, Goodys Powder, BC Powder. ?Stop ANY OVER THE COUNTER supplements until after surgery multivitamin, niacin, ?You may however, continue to take Tylenol if needed for pain up until the day of  surgery. ? ?No Alcohol for 24 hours before or after surgery. ? ?No Smoking including e-cigarettes for 24 hours prior to surgery.  ?No chewable tobacco products for at least 6 hours prior to surgery.  ?No nicotine patches on the day of surgery. ? ?Do not use any "recreational" drugs for at least a week prior to your surgery.  ?Please be advised that the combination of cocaine and anesthesia may have negative outcomes, up to and including death. ?If you test positive for cocaine, your surgery will be cancelled. ? ?On the morning of surgery brush your teeth with toothpaste and water, you may rinse your mouth with mouthwash if you wish. ?Do not swallow any toothpaste or mouthwash. ? ?Use CHG Soap or wipes as directed on instruction sheet-provided for you ? ?Do not wear jewelry, make-up, hairpins, clips or nail polish. ? ?Do not wear lotions, powders, or perfumes.  ? ?Do not shave body from the neck down 48 hours prior to surgery just in case you cut yourself which could leave a site for infection.  ?Also, freshly shaved skin may become irritated if using the CHG soap. ? ?Contact lenses, hearing aids and dentures may not be worn into surgery. ? ?Do not bring valuables to the hospital. [facility] is not responsible for any missing/lost belongings or valuables.  ? ?Notify your doctor if there is any change in your medical condition (cold, fever, infection). ? ?Wear comfortable clothing (specific to your surgery type) to the hospital. ? ?After surgery, you can help prevent lung complications by doing breathing exercises.  ?Take deep breaths and cough  every 1-2 hours. Your doctor may order a device called an Incentive Spirometer to help you take deep breaths. ? ?If you are being admitted to the hospital overnight, leave your suitcase in the car. ?After surgery it may be brought to your room. ? ?If you are being discharged the day of surgery, you will not be allowed to drive home. ?You will need a responsible adult (18 years  or older) to drive you home and stay with you that night.  ? ?If you are taking public transportation, you will need to have a responsible adult (18 years or older) with you. ?Please confirm with your physician that it is acceptable to use public transportation.  ? ?Please call the Pre-admissions Testing Dept. at [phone] if you have any questions about these instructions. ? ?Surgery Visitation Policy: ? ?Patients undergoing a surgery or procedure may have two family members or support persons with them as long as the person is not COVID-19 positive or experiencing its symptoms.  ? ?Inpatient Visitation:   ? ?Visiting hours are 7 a.m. to 8 p.m. ?Up to four visitors are allowed at one time in a patient room, including children. The visitors may rotate out with other people during the day. One designated support person (adult) may remain overnight.  ?

## 2021-05-01 LAB — SURGICAL PCR SCREEN
MRSA, PCR: POSITIVE — AB
Staphylococcus aureus: POSITIVE — AB

## 2021-05-07 MED ORDER — POVIDONE-IODINE 10 % EX SWAB
2.0000 | Freq: Once | CUTANEOUS | Status: AC
Start: 2021-05-07 — End: 2021-05-08
  Administered 2021-05-08: 2 via TOPICAL

## 2021-05-07 MED ORDER — LACTATED RINGERS IV SOLN
INTRAVENOUS | Status: DC
Start: 2021-05-07 — End: 2021-05-08

## 2021-05-07 MED ORDER — LACTATED RINGERS IV SOLN: INTRAVENOUS | Status: DC

## 2021-05-07 MED ORDER — CHLORHEXIDINE GLUCONATE 0.12 % MT SOLN
15.0000 mL | Freq: Once | OROMUCOSAL | Status: AC
Start: 2021-05-07 — End: 2021-05-08

## 2021-05-07 MED ORDER — ORAL CARE MOUTH RINSE: 15.0000 mL | Freq: Once | OROMUCOSAL | Status: AC

## 2021-05-07 MED ORDER — CEFAZOLIN SODIUM-DEXTROSE 2-4 GM/100ML-% IV SOLN
2.0000 g | INTRAVENOUS | Status: AC
  Administered 2021-05-08: 2 g via INTRAVENOUS

## 2021-05-07 MED ORDER — TRANEXAMIC ACID-NACL 1000-0.7 MG/100ML-% IV SOLN
1000.0000 mg | INTRAVENOUS | Status: AC
  Administered 2021-05-08: 1000 mg via INTRAVENOUS

## 2021-05-08 ENCOUNTER — Ambulatory Visit: Payer: Managed Care, Other (non HMO)

## 2021-05-08 ENCOUNTER — Encounter
Admission: RE | Disposition: A | Discharge status: Home / Self Care | Payer: Self-pay | Source: Home / Self Care | Attending: Orthopedic Surgery

## 2021-05-08 ENCOUNTER — Ambulatory Visit: Payer: Managed Care, Other (non HMO) | Admitting: Anesthesiology

## 2021-05-08 ENCOUNTER — Other Ambulatory Visit: Payer: Self-pay

## 2021-05-08 ENCOUNTER — Observation Stay
Admission: RE | Admit: 2021-05-08 | Discharge: 2021-05-10 | Disposition: A | Discharge status: Home / Self Care | Payer: Managed Care, Other (non HMO) | Attending: Orthopedic Surgery | Admitting: Orthopedic Surgery

## 2021-05-08 ENCOUNTER — Encounter: Payer: Self-pay | Admitting: Orthopedic Surgery

## 2021-05-08 DIAGNOSIS — M1611 Unilateral primary osteoarthritis, right hip: Secondary | ICD-10-CM | POA: Diagnosis present

## 2021-05-08 DIAGNOSIS — Z96641 Presence of right artificial hip joint: Secondary | ICD-10-CM

## 2021-05-08 HISTORY — PX: TOTAL HIP ARTHROPLASTY: SHX124

## 2021-05-08 SURGERY — ARTHROPLASTY, HIP, TOTAL, ANTERIOR APPROACH
Anesthesia: Spinal | Site: Hip | Laterality: Right

## 2021-05-08 MED ORDER — STERILE WATER FOR IRRIGATION IR SOLN
Status: DC | PRN
  Administered 2021-05-08: 1000 mL

## 2021-05-08 MED ORDER — 0.9 % SODIUM CHLORIDE (POUR BTL) OPTIME
TOPICAL | Status: DC | PRN
  Administered 2021-05-08: 1000 mL

## 2021-05-08 MED ORDER — DOCUSATE SODIUM 100 MG PO CAPS
100.0000 mg | ORAL_CAPSULE | Freq: Two times a day (BID) | ORAL | Status: DC
  Administered 2021-05-08 – 2021-05-10 (×4): 100 mg via ORAL
  Filled 2021-05-08 (×4): qty 1

## 2021-05-08 MED ORDER — OXYCODONE HCL 5 MG/5ML PO SOLN: 5.0000 mg | Freq: Once | ORAL | Status: DC | PRN

## 2021-05-08 MED ORDER — ONDANSETRON HCL 4 MG/2ML IJ SOLN
INTRAMUSCULAR | Status: AC
  Filled 2021-05-08: qty 2

## 2021-05-08 MED ORDER — BISACODYL 10 MG RE SUPP
10.0000 mg | Freq: Every day | RECTAL | Status: DC | PRN
  Filled 2021-05-08: qty 1

## 2021-05-08 MED ORDER — FLUTICASONE PROPIONATE 50 MCG/ACT NA SUSP
1.0000 | Freq: Every morning | NASAL | Status: DC
  Administered 2021-05-08 – 2021-05-10 (×2): 1 via NASAL
  Filled 2021-05-08 (×2): qty 16

## 2021-05-08 MED ORDER — BUPIVACAINE-EPINEPHRINE (PF) 0.25% -1:200000 IJ SOLN
INTRAMUSCULAR | Status: DC | PRN
  Administered 2021-05-08: 30 mL

## 2021-05-08 MED ORDER — LACTATED RINGERS IV SOLN: INTRAVENOUS | Status: DC

## 2021-05-08 MED ORDER — BUPIVACAINE HCL (PF) 0.5 % IJ SOLN
INTRAMUSCULAR | Status: DC | PRN
  Administered 2021-05-08: 2.5 mL via INTRATHECAL

## 2021-05-08 MED ORDER — PANTOPRAZOLE SODIUM 40 MG PO TBEC
DELAYED_RELEASE_TABLET | ORAL | Status: AC
  Filled 2021-05-08: qty 1

## 2021-05-08 MED ORDER — FENTANYL CITRATE (PF) 100 MCG/2ML IJ SOLN: 25.0000 ug | INTRAMUSCULAR | Status: DC | PRN

## 2021-05-08 MED ORDER — BUPIVACAINE HCL (PF) 0.5 % IJ SOLN
INTRAMUSCULAR | Status: AC
  Filled 2021-05-08: qty 10

## 2021-05-08 MED ORDER — ASPIRIN 81 MG PO CHEW
81.0000 mg | CHEWABLE_TABLET | Freq: Two times a day (BID) | ORAL | Status: DC
  Administered 2021-05-08 – 2021-05-10 (×4): 81 mg via ORAL
  Filled 2021-05-08 (×4): qty 1

## 2021-05-08 MED ORDER — ONDANSETRON HCL 4 MG PO TABS
4.0000 mg | ORAL_TABLET | Freq: Four times a day (QID) | ORAL | Status: DC | PRN
  Administered 2021-05-09 – 2021-05-10 (×2): 4 mg via ORAL
  Filled 2021-05-08 (×2): qty 1

## 2021-05-08 MED ORDER — DEXMEDETOMIDINE (PRECEDEX) IN NS 20 MCG/5ML (4 MCG/ML) IV SYRINGE
PREFILLED_SYRINGE | INTRAVENOUS | Status: DC | PRN
  Administered 2021-05-08: 12 ug via INTRAVENOUS
  Administered 2021-05-08: 8 ug via INTRAVENOUS

## 2021-05-08 MED ORDER — PRONTOSAN WOUND IRRIGATION OPTIME
TOPICAL | Status: DC | PRN
  Administered 2021-05-08: 1

## 2021-05-08 MED ORDER — ACETAMINOPHEN 500 MG PO TABS
ORAL_TABLET | ORAL | Status: AC
Start: 2021-05-08 — End: 2021-05-08
  Administered 2021-05-08: 1000 mg via ORAL
  Filled 2021-05-08: qty 2

## 2021-05-08 MED ORDER — GLYCOPYRROLATE 0.2 MG/ML IJ SOLN
INTRAMUSCULAR | Status: DC | PRN
Start: 2021-05-08 — End: 2021-05-08
  Administered 2021-05-08: .2 mg via INTRAVENOUS

## 2021-05-08 MED ORDER — PROPOFOL 500 MG/50ML IV EMUL
INTRAVENOUS | Status: DC | PRN
  Administered 2021-05-08: 50 ug/kg/min via INTRAVENOUS

## 2021-05-08 MED ORDER — MIDAZOLAM HCL 5 MG/5ML IJ SOLN
INTRAMUSCULAR | Status: DC | PRN
  Administered 2021-05-08 (×2): 2 mg via INTRAVENOUS

## 2021-05-08 MED ORDER — ACETAMINOPHEN 325 MG PO TABS
325.0000 mg | ORAL_TABLET | Freq: Four times a day (QID) | ORAL | Status: DC | PRN
  Administered 2021-05-09 – 2021-05-10 (×4): 650 mg via ORAL
  Filled 2021-05-08 (×4): qty 2

## 2021-05-08 MED ORDER — HYDROMORPHONE HCL 2 MG PO TABS
1.0000 mg | ORAL_TABLET | ORAL | Status: DC | PRN
  Administered 2021-05-09: 1 mg via ORAL
  Filled 2021-05-08: qty 1

## 2021-05-08 MED ORDER — HYDROMORPHONE HCL 1 MG/ML IJ SOLN
0.5000 mg | INTRAMUSCULAR | Status: DC | PRN
  Administered 2021-05-08: 1 mg via INTRAVENOUS
  Filled 2021-05-08: qty 1

## 2021-05-08 MED ORDER — METOCLOPRAMIDE HCL 5 MG/ML IJ SOLN
5.0000 mg | Freq: Three times a day (TID) | INTRAMUSCULAR | Status: DC | PRN
  Filled 2021-05-08: qty 2

## 2021-05-08 MED ORDER — KETOROLAC TROMETHAMINE 15 MG/ML IJ SOLN
INTRAMUSCULAR | Status: AC
  Filled 2021-05-08: qty 1

## 2021-05-08 MED ORDER — MENTHOL 3 MG MT LOZG: 1.0000 | LOZENGE | OROMUCOSAL | Status: DC | PRN

## 2021-05-08 MED ORDER — GEMFIBROZIL 600 MG PO TABS
600.0000 mg | ORAL_TABLET | Freq: Two times a day (BID) | ORAL | Status: DC
  Administered 2021-05-09 – 2021-05-10 (×2): 600 mg via ORAL
  Filled 2021-05-08 (×5): qty 1

## 2021-05-08 MED ORDER — ACETAMINOPHEN 500 MG PO TABS
1000.0000 mg | ORAL_TABLET | Freq: Four times a day (QID) | ORAL | Status: AC
  Administered 2021-05-08 – 2021-05-09 (×3): 1000 mg via ORAL
  Filled 2021-05-08 (×2): qty 2

## 2021-05-08 MED ORDER — LIDOCAINE HCL (PF) 2 % IJ SOLN
INTRAMUSCULAR | Status: DC | PRN
  Administered 2021-05-08: 50 mg

## 2021-05-08 MED ORDER — ONDANSETRON HCL 4 MG/2ML IJ SOLN
INTRAMUSCULAR | Status: DC | PRN
  Administered 2021-05-08: 4 mg via INTRAVENOUS

## 2021-05-08 MED ORDER — PANTOPRAZOLE SODIUM 40 MG PO TBEC
80.0000 mg | DELAYED_RELEASE_TABLET | Freq: Every day | ORAL | Status: DC
Start: 2021-05-08 — End: 2021-05-10
  Administered 2021-05-08 – 2021-05-10 (×3): 80 mg via ORAL
  Filled 2021-05-08 (×2): qty 2

## 2021-05-08 MED ORDER — HYDROMORPHONE HCL 2 MG PO TABS
ORAL_TABLET | ORAL | Status: AC
  Administered 2021-05-08: 2 mg via ORAL
  Filled 2021-05-08: qty 1

## 2021-05-08 MED ORDER — ONDANSETRON HCL 4 MG/2ML IJ SOLN
INTRAMUSCULAR | Status: AC
  Administered 2021-05-08: 4 mg via INTRAVENOUS
  Filled 2021-05-08: qty 2

## 2021-05-08 MED ORDER — CEFAZOLIN SODIUM-DEXTROSE 2-4 GM/100ML-% IV SOLN
INTRAVENOUS | Status: AC
Start: 2021-05-08 — End: 2021-05-08
  Filled 2021-05-08: qty 100

## 2021-05-08 MED ORDER — OXYCODONE HCL 5 MG PO TABS: 5.0000 mg | ORAL_TABLET | Freq: Once | ORAL | Status: DC | PRN

## 2021-05-08 MED ORDER — LORATADINE 10 MG PO TABS
10.0000 mg | ORAL_TABLET | Freq: Every day | ORAL | Status: DC
  Administered 2021-05-09 – 2021-05-10 (×2): 10 mg via ORAL
  Filled 2021-05-08 (×2): qty 1

## 2021-05-08 MED ORDER — TAMSULOSIN HCL 0.4 MG PO CAPS
0.4000 mg | ORAL_CAPSULE | Freq: Every day | ORAL | Status: DC
  Administered 2021-05-09 – 2021-05-10 (×2): 0.4 mg via ORAL
  Filled 2021-05-08 (×3): qty 1

## 2021-05-08 MED ORDER — VANCOMYCIN HCL IN DEXTROSE 1-5 GM/200ML-% IV SOLN
INTRAVENOUS | Status: AC
  Administered 2021-05-08: 1000 mg via INTRAVENOUS
  Filled 2021-05-08: qty 200

## 2021-05-08 MED ORDER — CHLORHEXIDINE GLUCONATE 0.12 % MT SOLN
OROMUCOSAL | Status: AC
  Administered 2021-05-08: 15 mL via OROMUCOSAL
  Filled 2021-05-08: qty 15

## 2021-05-08 MED ORDER — KETOROLAC TROMETHAMINE 15 MG/ML IJ SOLN
INTRAMUSCULAR | Status: AC
Start: 2021-05-08 — End: 2021-05-08
  Administered 2021-05-08: 15 mg via INTRAVENOUS
  Filled 2021-05-08: qty 1

## 2021-05-08 MED ORDER — ALUM & MAG HYDROXIDE-SIMETH 200-200-20 MG/5ML PO SUSP
30.0000 mL | ORAL | Status: DC | PRN
  Administered 2021-05-09 – 2021-05-10 (×2): 30 mL via ORAL
  Filled 2021-05-08 (×2): qty 30

## 2021-05-08 MED ORDER — VANCOMYCIN HCL IN DEXTROSE 1-5 GM/200ML-% IV SOLN
1000.0000 mg | Freq: Two times a day (BID) | INTRAVENOUS | Status: AC
  Administered 2021-05-08: 1000 mg via INTRAVENOUS
  Filled 2021-05-08: qty 200

## 2021-05-08 MED ORDER — VANCOMYCIN HCL IN DEXTROSE 1-5 GM/200ML-% IV SOLN: 1000.0000 mg | Freq: Once | INTRAVENOUS | Status: AC

## 2021-05-08 MED ORDER — MAGNESIUM HYDROXIDE 400 MG/5ML PO SUSP
30.0000 mL | Freq: Every day | ORAL | Status: DC | PRN
  Administered 2021-05-09: 30 mL via ORAL
  Filled 2021-05-08 (×2): qty 30

## 2021-05-08 MED ORDER — PROPOFOL 10 MG/ML IV BOLUS
INTRAVENOUS | Status: DC | PRN
  Administered 2021-05-08 (×2): 40 mg via INTRAVENOUS

## 2021-05-08 MED ORDER — KETOROLAC TROMETHAMINE 15 MG/ML IJ SOLN
15.0000 mg | Freq: Four times a day (QID) | INTRAMUSCULAR | Status: AC
  Administered 2021-05-08 – 2021-05-09 (×2): 15 mg via INTRAVENOUS
  Filled 2021-05-08: qty 1

## 2021-05-08 MED ORDER — PANTOPRAZOLE SODIUM 40 MG PO TBEC
DELAYED_RELEASE_TABLET | ORAL | Status: AC
Start: 2021-05-08 — End: 2021-05-08
  Filled 2021-05-08: qty 1

## 2021-05-08 MED ORDER — ACETAMINOPHEN 500 MG PO TABS
ORAL_TABLET | ORAL | Status: AC
  Filled 2021-05-08: qty 2

## 2021-05-08 MED ORDER — MIDAZOLAM HCL 2 MG/2ML IJ SOLN
INTRAMUSCULAR | Status: AC
  Filled 2021-05-08: qty 2

## 2021-05-08 MED ORDER — FENTANYL CITRATE (PF) 100 MCG/2ML IJ SOLN
INTRAMUSCULAR | Status: AC
  Filled 2021-05-08: qty 2

## 2021-05-08 MED ORDER — PHENOL 1.4 % MT LIQD: 1.0000 | OROMUCOSAL | Status: DC | PRN

## 2021-05-08 MED ORDER — ALUM & MAG HYDROXIDE-SIMETH 200-200-20 MG/5ML PO SUSP
ORAL | Status: AC
  Administered 2021-05-08: 30 mL via ORAL
  Filled 2021-05-08: qty 30

## 2021-05-08 MED ORDER — METOCLOPRAMIDE HCL 10 MG PO TABS
5.0000 mg | ORAL_TABLET | Freq: Three times a day (TID) | ORAL | Status: DC | PRN
  Administered 2021-05-09: 10 mg via ORAL
  Filled 2021-05-08: qty 1

## 2021-05-08 MED ORDER — BUPIVACAINE-EPINEPHRINE (PF) 0.25% -1:200000 IJ SOLN
INTRAMUSCULAR | Status: AC
  Filled 2021-05-08: qty 30

## 2021-05-08 MED ORDER — TRANEXAMIC ACID-NACL 1000-0.7 MG/100ML-% IV SOLN
INTRAVENOUS | Status: AC
  Filled 2021-05-08: qty 100

## 2021-05-08 MED ORDER — ACETAMINOPHEN 10 MG/ML IV SOLN: 1000.0000 mg | Freq: Once | INTRAVENOUS | Status: DC | PRN

## 2021-05-08 MED ORDER — GLYCOPYRROLATE 0.2 MG/ML IJ SOLN
INTRAMUSCULAR | Status: AC
  Filled 2021-05-08: qty 1

## 2021-05-08 MED ORDER — ONDANSETRON HCL 4 MG/2ML IJ SOLN
4.0000 mg | Freq: Four times a day (QID) | INTRAMUSCULAR | Status: DC | PRN
  Administered 2021-05-08 – 2021-05-09 (×2): 4 mg via INTRAVENOUS
  Filled 2021-05-08 (×2): qty 2

## 2021-05-08 MED ORDER — HYDROMORPHONE HCL 2 MG PO TABS
2.0000 mg | ORAL_TABLET | ORAL | Status: DC | PRN
  Administered 2021-05-09: 2 mg via ORAL
  Filled 2021-05-08 (×2): qty 1

## 2021-05-08 MED ORDER — LORATADINE 10 MG PO TABS
ORAL_TABLET | ORAL | Status: AC
Start: 2021-05-08 — End: 2021-05-08
  Administered 2021-05-08: 10 mg via ORAL
  Filled 2021-05-08: qty 1

## 2021-05-08 MED ORDER — SODIUM CHLORIDE 0.9 % IR SOLN
Status: DC | PRN
  Administered 2021-05-08: 3000 mL

## 2021-05-08 MED ORDER — LIDOCAINE HCL (PF) 2 % IJ SOLN
INTRAMUSCULAR | Status: AC
  Filled 2021-05-08: qty 5

## 2021-05-08 MED ORDER — PROPOFOL 1000 MG/100ML IV EMUL
INTRAVENOUS | Status: AC
  Filled 2021-05-08: qty 100

## 2021-05-08 MED ORDER — FENTANYL CITRATE (PF) 100 MCG/2ML IJ SOLN
INTRAMUSCULAR | Status: DC | PRN
  Administered 2021-05-08 (×2): 50 ug via INTRAVENOUS

## 2021-05-08 MED ORDER — NIACIN ER (ANTIHYPERLIPIDEMIC) 500 MG PO TBCR
500.0000 mg | EXTENDED_RELEASE_TABLET | Freq: Every evening | ORAL | Status: DC
  Administered 2021-05-08 – 2021-05-09 (×2): 500 mg via ORAL
  Filled 2021-05-08 (×3): qty 1

## 2021-05-08 MED ORDER — ONDANSETRON HCL 4 MG/2ML IJ SOLN: 4.0000 mg | Freq: Once | INTRAMUSCULAR | Status: AC | PRN

## 2021-05-08 MED ORDER — PHENYLEPHRINE HCL (PRESSORS) 10 MG/ML IV SOLN
INTRAVENOUS | Status: DC | PRN
Start: 2021-05-08 — End: 2021-05-08
  Administered 2021-05-08 (×2): 160 ug via INTRAVENOUS
  Administered 2021-05-08 (×2): 80 ug via INTRAVENOUS

## 2021-05-08 SURGICAL SUPPLY — 58 items
BLADE SAGITTAL WIDE XTHICK NO (BLADE) ×2 IMPLANT
BNDG COHESIVE 4X5 TAN ST LF (GAUZE/BANDAGES/DRESSINGS) ×4 IMPLANT
BRUSH SCRUB EZ  4% CHG (MISCELLANEOUS) ×1
BRUSH SCRUB EZ 4% CHG (MISCELLANEOUS) ×1 IMPLANT
CHLORAPREP W/TINT 26 (MISCELLANEOUS) ×2 IMPLANT
COVER HOLE (Hips) ×1 IMPLANT
CUP R3 50MM (Hips) ×1 IMPLANT
DRAPE 3/4 80X56 (DRAPES) ×2 IMPLANT
DRAPE C-ARM 42X72 X-RAY (DRAPES) ×2 IMPLANT
DRAPE STERI IOBAN 125X83 (DRAPES) IMPLANT
DRAPE U-SHAPE 47X51 STRL (DRAPES) ×2 IMPLANT
DRESSING AQUACEL AG SP 3.5X10 (GAUZE/BANDAGES/DRESSINGS) IMPLANT
DRSG AQUACEL AG ADV 3.5X10 (GAUZE/BANDAGES/DRESSINGS) IMPLANT
DRSG AQUACEL AG ADV 3.5X14 (GAUZE/BANDAGES/DRESSINGS) IMPLANT
DRSG AQUACEL AG SP 3.5X10 (GAUZE/BANDAGES/DRESSINGS) ×2
ELECT REM PT RETURN 9FT ADLT (ELECTROSURGICAL) ×2
ELECTRODE REM PT RTRN 9FT ADLT (ELECTROSURGICAL) ×1 IMPLANT
GAUZE 4X4 16PLY ~~LOC~~+RFID DBL (SPONGE) ×2 IMPLANT
GAUZE XEROFORM 1X8 LF (GAUZE/BANDAGES/DRESSINGS) ×1 IMPLANT
GLOVE SURG ENC MOIS LTX SZ8 (GLOVE) ×2 IMPLANT
GLOVE SURG ORTHO LTX SZ8.5 (GLOVE) ×2 IMPLANT
GLOVE SURG UNDER POLY LF SZ8.5 (GLOVE) ×4 IMPLANT
GOWN STRL REUS W/ TWL XL LVL3 (GOWN DISPOSABLE) ×2 IMPLANT
GOWN STRL REUS W/TWL XL LVL3 (GOWN DISPOSABLE) ×2
HEAD FEMORAL OXINIUM PLUS4 32M (Hips) ×1 IMPLANT
HOLSTER ELECTROSUGICAL PENCIL (MISCELLANEOUS) ×2 IMPLANT
HOOD PEEL AWAY FLYTE STAYCOOL (MISCELLANEOUS) ×6 IMPLANT
IV NS 250ML (IV SOLUTION)
IV NS 250ML BAXH (IV SOLUTION) IMPLANT
IV NS IRRIG 3000ML ARTHROMATIC (IV SOLUTION) ×2 IMPLANT
KIT PATIENT CARE HANA TABLE (KITS) ×2 IMPLANT
KIT TURNOVER CYSTO (KITS) ×2 IMPLANT
LINER 0 DEG 32X50MM (Hips) ×1 IMPLANT
MANIFOLD NEPTUNE II (INSTRUMENTS) ×2 IMPLANT
MAT ABSORB  FLUID 56X50 GRAY (MISCELLANEOUS) ×1
MAT ABSORB FLUID 56X50 GRAY (MISCELLANEOUS) ×1 IMPLANT
NDL SAFETY ECLIPSE 18X1.5 (NEEDLE) IMPLANT
NDL SPNL 20GX3.5 QUINCKE YW (NEEDLE) ×1 IMPLANT
NEEDLE HYPO 18GX1.5 SHARP (NEEDLE)
NEEDLE HYPO 22GX1.5 SAFETY (NEEDLE) ×2 IMPLANT
NEEDLE SPNL 20GX3.5 QUINCKE YW (NEEDLE) ×2 IMPLANT
PACK HIP PROSTHESIS (MISCELLANEOUS) ×2 IMPLANT
PADDING CAST BLEND 4X4 NS (MISCELLANEOUS) ×4 IMPLANT
PILLOW ABDUCTION MEDIUM (MISCELLANEOUS) ×2 IMPLANT
PULSAVAC PLUS IRRIG FAN TIP (DISPOSABLE) ×2
SCREW 6.5X25MM (Screw) ×1 IMPLANT
SOLUTION PRONTOSAN WOUND 350ML (IRRIGATION / IRRIGATOR) ×2 IMPLANT
SPONGE T-LAP 18X18 ~~LOC~~+RFID (SPONGE) ×8 IMPLANT
STAPLER SKIN PROX 35W (STAPLE) ×2 IMPLANT
STEM LATERAL COLLAR POLARSTEM (Stem) ×1 IMPLANT
SUT BONE WAX W31G (SUTURE) ×2 IMPLANT
SUT DVC 2 QUILL PDO  T11 36X36 (SUTURE) ×1
SUT DVC 2 QUILL PDO T11 36X36 (SUTURE) ×1 IMPLANT
SUT VIC AB 2-0 CT1 18 (SUTURE) ×2 IMPLANT
SYR 20ML LL LF (SYRINGE) ×2 IMPLANT
TIP FAN IRRIG PULSAVAC PLUS (DISPOSABLE) ×1 IMPLANT
WAND WEREWOLF FASTSEAL 6.0 (MISCELLANEOUS) ×2 IMPLANT
WATER STERILE IRR 500ML POUR (IV SOLUTION) ×2 IMPLANT

## 2021-05-08 NOTE — Transfer of Care (Signed)
Immediate Anesthesia Transfer of Care Note ? ?Patient: Laura Adams ? ?Procedure(s) Performed: TOTAL HIP ARTHROPLASTY ANTERIOR APPROACH (Right: Hip) ? ?Patient Location: PACU ? ?Anesthesia Type:Spinal ? ?Level of Consciousness: awake, alert  and oriented ? ?Airway & Oxygen Therapy: Patient Spontanous Breathing ? ?Post-op Assessment: Report given to RN and Post -op Vital signs reviewed and stable ? ?Post vital signs: Reviewed ? ?Last Vitals:  ?Vitals Value Taken Time  ?BP    ?Temp    ?Pulse 130 05/08/21 0927  ?Resp 20 05/08/21 0927  ?SpO2 95 % 05/08/21 0927  ?Vitals shown include unvalidated device data. ? ?Last Pain:  ?Vitals:  ? 05/08/21 0628  ?TempSrc: Temporal  ?PainSc: 0-No pain  ?   ? ?  ? ?Complications: No notable events documented. ?

## 2021-05-08 NOTE — TOC Progression Note (Signed)
Transition of Care (TOC) - Progression Note  ? ? ?Patient Details  ?Name: Laura Adams ?MRN: [id number] ?Date of Birth: [birth year] ? ?Transition of Care (TOC) CM/SW Contact  ?Marlowe Sax, RN ?Phone Number: ?05/08/2021, 1:23 PM ? ?Clinical Narrative:    ? ?Patient has [employer] Ins, Unable to locate [facility] services, Will need to be set up with Outpatient PT, Physician aware ? ?  ?  ? ?Expected Discharge Plan and Services ?  ?  ?  ?  ?  ?                ?  ?  ?  ?  ?  ?  ?  ?  ?  ?  ? ? ?Social Determinants of Health (SDOH) Interventions ?  ? ?Readmission Risk Interventions ?   ? View : No data to display.  ?  ?  ?  ? ? ?

## 2021-05-08 NOTE — Progress Notes (Signed)
?   05/08/21 0700  ?Clinical Encounter Type  ?Visited With Patient and family together  ?Visit Type Initial;Pre-op  ? ?Chaplain provided pre-op support ?

## 2021-05-08 NOTE — Anesthesia Procedure Notes (Signed)
Spinal ? ?Patient location during procedure: OR ?Reason for block: surgical anesthesia ?Staffing ?Performed: resident/CRNA  ?Anesthesiologist: Darrin Nipper, MD ?Resident/CRNA: Rolla Plate, CRNA ?Preanesthetic Checklist ?Completed: patient identified, IV checked, site marked, risks and benefits discussed, surgical consent, monitors and equipment checked, pre-op evaluation and timeout performed ?Spinal Block ?Patient position: sitting ?Prep: ChloraPrep and site prepped and draped ?Patient monitoring: heart rate, continuous pulse ox, blood pressure and cardiac monitor ?Approach: midline ?Location: L4-5 ?Injection technique: single-shot ?Needle ?Needle type: Introducer and Pencan  ?Needle gauge: 24 G ?Needle length: 9 cm ?Assessment ?Events: CSF return ?Additional Notes ?Negative paresthesia. Negative blood return. Positive free-flowing CSF. Expiration date of kit checked and confirmed. Patient tolerated procedure well, without complications. ? ? ? ? ? ?

## 2021-05-08 NOTE — H&P (Signed)
The patient has been re-examined, and the chart reviewed, and there have been no interval changes to the documented history and physical.  Plan a right total hip today.  Anesthesia is not consulted regarding a peripheral nerve block for post-operative pain.  The risks, benefits, and alternatives have been discussed at length, and the patient is willing to proceed.    

## 2021-05-08 NOTE — Evaluation (Signed)
Physical Therapy Evaluation ?Patient Details ?Name: Laura DanielsRegina L Adams ?MRN: [id number] ?DOB: 01/16/1965 ?Today's Date: 05/08/2021 ? ?History of Present Illness ? Laura Adams is a 55yoF who [facility] for elective Rt hip THA with Dr. Cassell SmilesJames Bowers. PTA pt was still working, AMB as needed with severe pain but no device, no falls. Pt lives at home with husband 1 floor home, steps to enter.  ?Clinical Impression ? Pt admitted with above diagnosis. Pt currently with functional limitations due to the deficits listed below (see "PT Problem List"). Upon entry, pt in bed, awake and agreeable to participate- pt in periop area on POD0. The pt is alert, pleasant, interactive, and able to provide info regarding prior level of function, both in tolerance and independence. Pt has regained most of RLE neurological function since surgery still has some dorsal pedal paresthesias and lacks any observable activation of toe or ankle dorsiflexors. Pt able to perform bed mobility and transfers will mild to moderate effort, walking with supervision to minGuard assist due to acute foot drop. Education provided for safety in gait which is optimized for AMB to BR only at this time. MD and RN aware of foot drop issue. Patient's performance this date reveals decreased ability, independence, and tolerance in performing all basic mobility required for performance of activities of daily living. Pt requires additional DME, close physical assistance, and cues for safe participate in mobility. Pt will benefit from skilled PT intervention to increase independence and safety with basic mobility in preparation for discharge to the venue listed below.   ?   ? ?Recommendations for follow up therapy are one component of a multi-disciplinary discharge planning process, led by the attending physician.  Recommendations may be updated based on patient status, additional functional criteria and insurance authorization. ? ?Follow Up Recommendations  Outpatient PT (unable to receive HHPT due to limitations in payer source) ? ?  ?Assistance Recommended at Discharge Intermittent Supervision/Assistance  ?Patient can return home with the following ? A little help with walking and/or transfers;A little help with bathing/dressing/bathroom;Assistance with cooking/housework;Assist for transportation;Help with stairs or ramp for entrance ? ?  ?Equipment Recommendations Rolling walker (2 wheels)  ?Recommendations for Other Services ?    ?  ?Functional Status Assessment Patient has had a recent decline in their functional status and demonstrates the ability to make significant improvements in function in a reasonable and predictable amount of time.  ? ?  ?Precautions / Restrictions Precautions ?Precautions: None;Fall ?Precaution Booklet Issued: Yes (comment) ?Precaution Comments: foot drop on POD 0 ?Restrictions ?Weight Bearing Restrictions: Yes ?RLE Weight Bearing: Weight bearing as tolerated  ? ?  ? ?Mobility ? Bed Mobility ?Overal bed mobility: Modified Independent ?  ?  ?  ?  ?  ?  ?General bed mobility comments: moving well ?  ? ?Transfers ?Overall transfer level: Needs assistance ?Equipment used: Rolling walker (2 wheels) ?Transfers: Sit to/from Stand, Bed to chair/wheelchair/BSC ?Sit to Stand: Min guard ?Stand pivot transfers: Min guard ?  ?  ?  ?  ?General transfer comment: cues for technique and safety ?  ? ?Ambulation/Gait ?Ambulation/Gait assistance: Supervision ?Gait Distance (Feet): 60 Feet (to/from bathroom) ?Assistive device: Rolling walker (2 wheels) ?Gait Pattern/deviations: Step-to pattern ?  ?  ?  ?General Gait Details: 3-point gait; author turns Rt sock inside out to facilitate foot slide in the setting of foot drop ? ?Stairs ?  ?  ?  ?  ?  ? ?Wheelchair Mobility ?  ? ?Modified Rankin (Stroke  Patients Only) ?  ? ?  ? ?Balance   ?  ?  ?  ?  ?  ?  ?  ?  ?  ?  ?  ?  ?  ?  ?  ?  ?  ?  ?   ? ? ? ?Pertinent Vitals/Pain Pain Assessment ?Pain Assessment:  Faces ?Faces Pain Scale: Hurts little more ?Pain Location: Rt posterior hip on toilet ?Pain Descriptors / Indicators: Aching ?Pain Intervention(s): Limited activity within patient's tolerance, Monitored during session, Premedicated before session, Repositioned  ? ? ?Home Living Family/patient expects to be discharged to:: Private residence ?Living Arrangements: Spouse/significant other ?Available Help at Discharge: Family ?Type of Home: House ?Home Access: Stairs to enter ?Entrance Stairs-Rails: Can reach both ?Entrance Stairs-Number of Steps: 3 ?  ?Home Layout: One level ?Home Equipment: BSC/3in1 ?Additional Comments: variety of DME from family members  ?  ?Prior Function Prior Level of Function : Independent/Modified Independent ?  ?  ?  ?  ?  ?  ?  ?  ?  ? ? ?Hand Dominance  ? Dominant Hand: Left ? ?  ?Extremity/Trunk Assessment  ? Upper Extremity Assessment ?Upper Extremity Assessment: Overall WFL for tasks assessed ?  ? ?Lower Extremity Assessment ?Lower Extremity Assessment: Overall WFL for tasks assessed;RLE deficits/detail ?RLE Deficits / Details: has active ankle PF, toe PF, knee extension, hip flexion, hip extension; No trace ankle dorsiflexion, no trace toe dorsiflexion. Has normal proprioception at 5 toes. Has sensation of upper lower leg, but tingling in Rt foot. ?  ? ?   ?Communication  ?    ?Cognition Arousal/Alertness: Awake/alert ?Behavior During Therapy: [facility] for tasks assessed/performed ?Overall Cognitive Status: Within Functional Limits for tasks assessed ?  ?  ?  ?  ?  ?  ?  ?  ?  ?  ?  ?  ?  ?  ?  ?  ?  ?  ?  ? ?  ?General Comments   ? ?  ?Exercises Total Joint Exercises ?Ankle Circles/Pumps: Both, 10 reps, Supine (lacks ankle DF activation on surgical side) ?Quad Sets: AROM, Right, 5 reps, Supine ?Heel Slides: AROM, Right, 5 reps, Supine ?Hip ABduction/ADduction: AROM, Right, 5 reps, Supine ?Long Arc Quad: AROM, Right, 5 reps, Supine  ? ?Assessment/Plan  ?  ?PT Assessment Patient needs  continued PT services  ?PT Problem List Decreased strength;Decreased range of motion;Decreased activity tolerance;Decreased balance;Decreased mobility;Decreased knowledge of precautions;Decreased safety awareness ? ?   ?  ?PT Treatment Interventions DME instruction;Balance training;Gait training;Stair training;Patient/family education;Therapeutic activities;Functional mobility training;Therapeutic exercise   ? ?PT Goals (Current goals can be found in the Care Plan section)  ?Acute Rehab PT Goals ?Patient Stated Goal: return to home and remain independent with mobility ?PT Goal Formulation: With patient ?Time For Goal Achievement: 05/22/21 ?Potential to Achieve Goals: Good ? ?  ?Frequency BID ?  ? ? ?Co-evaluation   ?  ?  ?  ?  ? ? ?  ?AM-PAC PT "6 Clicks" Mobility  ?Outcome Measure Help needed turning from your back to your side while in a flat bed without using bedrails?: None ?Help needed moving from lying on your back to sitting on the side of a flat bed without using bedrails?: None ?Help needed moving to and from a bed to a chair (including a wheelchair)?: A Little ?Help needed standing up from a chair using your arms (e.g., wheelchair or bedside chair)?: A Little ?Help needed to walk in hospital room?: A Little ?Help  needed climbing 3-5 steps with a railing? : A Little ?6 Click Score: 20 ? ?  ?End of Session Equipment Utilized During Treatment: Gait belt ?Activity Tolerance: Patient tolerated treatment well;No increased pain;Treatment limited secondary to medical complications (Comment) ?Patient left: in chair;with call bell/phone within reach;with nursing/sitter in room ?Nurse Communication: Mobility status ?PT Visit Diagnosis: Difficulty in walking, not elsewhere classified (R26.2);Other abnormalities of gait and mobility (R26.89) ?  ? ?Time: 8891-6945 ?PT Time Calculation (min) (ACUTE ONLY): 36 min ? ? ?Charges:   PT Evaluation ?$PT Eval Moderate Complexity: 1 Mod ?PT Treatments ?$Gait Training: 8-22 mins ?   ?   ? ?3:40 PM, 05/08/21 ?Rosamaria Lints, PT, DPT ?Physical Therapist - [place] ?[facility]  ?[phone] (ASCOM)  ? ? ?Jalyn Dutta C ?05/08/2021, 3:37 PM ? ?

## 2021-05-08 NOTE — Anesthesia Postprocedure Evaluation (Signed)
Anesthesia Post Note ? ?Patient: Laura Adams ? ?Procedure(s) Performed: TOTAL HIP ARTHROPLASTY ANTERIOR APPROACH (Right: Hip) ? ?Patient location during evaluation: PACU ?Anesthesia Type: Spinal ?Level of consciousness: awake and alert, oriented and patient cooperative ?Pain management: pain level controlled ?Vital Signs Assessment: post-procedure vital signs reviewed and stable ?Respiratory status: spontaneous breathing, nonlabored ventilation and respiratory function stable ?Cardiovascular status: blood pressure returned to baseline and stable ?Postop Assessment: adequate PO intake, no headache and spinal receding ?Anesthetic complications: no ? ? ?No notable events documented. ? ? ?Last Vitals:  ?Vitals:  ? 05/08/21 1246 05/08/21 1345  ?BP: 131/80 139/77  ?Pulse: 90 94  ?Resp: 18 18  ?Temp: 36.6 ?C 36.6 ?C  ?SpO2: 98% 98%  ?  ?Last Pain:  ?Vitals:  ? 05/08/21 1345  ?TempSrc: Temporal  ?PainSc: 0-No pain  ? ? ?  ?  ?  ?  ?  ?  ? ?Reed Breech ? ? ? ? ?

## 2021-05-08 NOTE — Anesthesia Preprocedure Evaluation (Addendum)
Anesthesia Evaluation  ?Patient identified by MRN, date of birth, ID band ?Patient awake ? ? ? ?Reviewed: ?Allergy & Precautions, NPO status , Patient's Chart, lab work & pertinent test results ? ?History of Anesthesia Complications ?(+) PONV and history of anesthetic complications ? ?Airway ?Mallampati: IV ? ? ?Neck ROM: Full ? ? ? Dental ?no notable dental hx. ? ?  ?Pulmonary ?neg pulmonary ROS,  ?  ?Pulmonary exam normal ?breath sounds clear to auscultation ? ? ? ? ? ? Cardiovascular ?Exercise Tolerance: Good ?negative cardio ROS ?Normal cardiovascular exam ?Rhythm:Regular Rate:Normal ? ? ?  ?Neuro/Psych ?negative neurological ROS ?   ? GI/Hepatic ?GERD  ,  ?Endo/Other  ?negative endocrine ROS ? Renal/GU ?Renal disease (nephrolithiasis)  ? ?  ?Musculoskeletal ? ? Abdominal ?  ?Peds ? Hematology ?negative hematology ROS ?(+)   ?Anesthesia Other Findings ? ? Reproductive/Obstetrics ? ?  ? ? ? ? ? ? ? ? ? ? ? ? ? ?  ?  ? ? ? ? ? ? ? ?Anesthesia Physical ?Anesthesia Plan ? ?ASA: 2 ? ?Anesthesia Plan: General and Spinal  ? ?Post-op Pain Management:   ? ?Induction: Intravenous ? ?PONV Risk Score and Plan: 4 or greater and Propofol infusion, TIVA, Treatment may vary due to age or medical condition and Ondansetron ? ?Airway Management Planned: Natural Airway and Nasal Cannula ? ?Additional Equipment:  ? ?Intra-op Plan:  ? ?Post-operative Plan:  ? ?Informed Consent: I have reviewed the patients History and Physical, chart, labs and discussed the procedure including the risks, benefits and alternatives for the proposed anesthesia with the patient or authorized representative who has indicated his/her understanding and acceptance.  ? ? ? ? ? ?Plan Discussed with: CRNA ? ?Anesthesia Plan Comments: (Plan for spinal and GA with natural airway, LMA/GETA backup.  Patient consented for risks of anesthesia including but not limited to:  ?- adverse reactions to medications ?- damage to eyes, teeth,  lips or other oral mucosa ?- nerve damage due to positioning  ?- sore throat or hoarseness ?- headache, bleeding, infection, nerve damage 2/2 spinal ?- damage to heart, brain, nerves, lungs, other parts of body or loss of life ? ?Informed patient about role of CRNA in peri- and intra-operative care.  Patient voiced understanding.)  ? ? ? ? ? ? ?Anesthesia Quick Evaluation ? ?

## 2021-05-08 NOTE — Op Note (Signed)
05/08/2021 ? ?9:26 AM ? ?PATIENT:  Laura Adams  ? ?MRN: [id number] ? ?PRE-OPERATIVE DIAGNOSIS:  Osteoarthritis right hip  ? ?POST-OPERATIVE DIAGNOSIS: Same ? ?Procedure: Right Total Hip Replacement ? ?Surgeon: Dola Argyle. Odis Luster, MD  ? ?Assist: Altamese Cabal, PA-C ? ?Anesthesia: Spinal  ? ?EBL: 200 mL  ? ?Specimens: None  ? ?Drains: None  ? ?Components used: A size 2 lateral Polarstem [employer], R3 size 50 mm shell, and a 32 mm +4 mm head  ? ? ?Description of the procedure in detail: After informed consent was obtained and the appropriate extremity marked in the pre-operative holding area, the patient was taken to the operating room and placed in the supine position on the fracture table. All pressure points were well padded and bilateral lower extremities were place in traction spars. The hip was prepped and draped in standard sterile fashion. A spinal anesthetic had been delivered by the anesthesia team. The skin and subcutaneous tissues were injected with a mixture of Marcaine with epinephrine for post-operative pain. A longitudinal incision approximately 10 cm in length was carried out from the anterior superior iliac spine to the greater trochanter. The tensor fascia was divided and blunt dissection was taken down to the level of the joint capsule. The lateral circumflex vessels were cauterized. Deep retractors were placed and a portion of the anterior capsule was excised. Using fluoroscopy the neck cut was planned and carried out with a sagittal saw. The head was passed from the field with use of a corkscrew and hip skid. Deep retractors were placed along the acetabulum and the degenerative labrum and large osteophytes were removed with a Rongeur. The cup was sequentially reamed to a size 50 mm. The wound was irrigated and using fluoroscopy the size 50 mm cup was impacted in to anatomic position. A single screw was placed followed by a threaded hole cover. The final liner was impacted in to position.  Attention was then turned to the proximal femur. The leg was placed in extension and external rotation. The canal was opened and sequentially broached to a size 2 lateral. The trial components were placed and the hip relocated. The components were found to be in good position using fluoroscopy. The hip was dislocated and the trial components removed. The final components were impacted in to position and the hip relocated. The final components were again check with fluoroscopy and found to be in good position. Hemostasis was achieved with electrocautery. The deep capsule was injected with Marcaine and epinephrine. The wound was irrigated with bacitracin laced normal saline and the tensor fascia closed with #2 Quill suture. The subcutaneous tissues were closed with 2-0 vicryl and staples for the skin. A sterile dressing was applied and an abduction pillow. Patient tolerated the procedure well and there were no apparent complication. Patient was taken to the recovery room in good condition.  ? ?Cassell Smiles, MD ? ? ?

## 2021-05-09 ENCOUNTER — Encounter: Payer: Self-pay | Admitting: Orthopedic Surgery

## 2021-05-09 DIAGNOSIS — M1611 Unilateral primary osteoarthritis, right hip: Secondary | ICD-10-CM | POA: Diagnosis not present

## 2021-05-09 MED ORDER — OXYCODONE HCL 5 MG PO TABS
5.0000 mg | ORAL_TABLET | ORAL | Status: DC | PRN
  Administered 2021-05-09 – 2021-05-10 (×6): 10 mg via ORAL
  Filled 2021-05-09 (×6): qty 2

## 2021-05-09 MED ORDER — ASPIRIN 81 MG PO CHEW: 81.0000 mg | CHEWABLE_TABLET | Freq: Two times a day (BID) | ORAL | 0 refills | Status: AC

## 2021-05-09 MED ORDER — HYDROMORPHONE HCL 2 MG PO TABS: 2.0000 mg | ORAL_TABLET | ORAL | 0 refills | Status: DC | PRN

## 2021-05-09 MED ORDER — MORPHINE SULFATE (PF) 2 MG/ML IV SOLN
1.0000 mg | INTRAVENOUS | Status: DC | PRN
  Filled 2021-05-09: qty 1

## 2021-05-09 NOTE — Plan of Care (Signed)

## 2021-05-09 NOTE — Progress Notes (Addendum)
?  Subjective: ? ?Patient reports pain as moderate.   ? ?Objective:  ? ?VITALS:   ?Vitals:  ? 05/08/21 1519 05/08/21 1849 05/08/21 2015 05/09/21 0454  ?BP: (!) 142/98 (!) 147/97 140/74 (!) 148/81  ?Pulse: (!) 107 99 95 95  ?Resp: 16 16 16 16   ?Temp: 97.9 ?F (36.6 ?C) 97.6 ?F (36.4 ?C) 98.5 ?F (36.9 ?C) 98.2 ?F (36.8 ?C)  ?TempSrc: Temporal  Oral Oral  ?SpO2: 98% 100% 96% 100%  ?Weight:      ?Height:      ? ? ?PHYSICAL EXAM: ? ?Neurologically intact ?ABD soft ?Neurovascular intact ?Sensation intact distally ?Intact pulses distally ?Dorsiflexion/Plantar flexion (right sided drop foot) ?Incision: dressing C/D/I ?No cellulitis present ?Compartment soft ? ? ?LABS ? ?No results found for this or any previous visit (from the past 24 hour(s)). ? ?DG C-Arm 1-60 Min-No Report ? ?Result Date: 05/08/2021 ?Fluoroscopy was utilized by the requesting physician.  No radiographic interpretation.  ? ?DG HIP UNILAT WITH PELVIS 1V RIGHT ? ?Result Date: 05/08/2021 ?CLINICAL DATA:  Right hip arthroplasty EXAM: DG HIP (WITH OR WITHOUT PELVIS) 1V RIGHT COMPARISON:  CT abdomen/pelvis 04/20/2020 FINDINGS: Three C-arm fluoroscopic images were obtained intraoperatively and submitted for post operative interpretation. Initial image demonstrates surgical hardware projecting over the femoral neck. There is marked degenerative change of the right hip. Subsequent images demonstrate postsurgical changes reflecting right hip arthroplasty. Hardware alignment is within expected limits, without evidence of immediate complication. Degenerative changes are also noted about the left hip. The symphysis pubis is intact. Fluoro time 12 seconds. Please see the performing provider's procedural report for further detail. IMPRESSION: Intraoperative images during right hip arthroplasty as above without evidence of immediate complication. Electronically Signed   By: Valetta Mole M.D.   On: 05/08/2021 09:13   ? ?Assessment/Plan: ?1 Day Post-Op  ? ?Principal Problem: ?   History of total hip replacement, right ? ? ?Advance diet ?Up with therapy ?Re-evaluate tomorrow ? ? ?Laura Adams , PA-C ?05/09/2021, 7:00 AM ? ?I discussed her right foot drop in detail. It may be related to the boot used in surgery or a traction injury. I would expect this to improve over the next 6 weeks and she will use an AFO until her motor function returns. All of her questions were answered. ? ? ? ?  ?

## 2021-05-09 NOTE — Progress Notes (Signed)
Physical Therapy Treatment ?Patient Details ?Name: Laura Adams ?MRN: [id number] ?DOB: 1965-01-28 ?Today's Date: 05/09/2021 ? ? ?History of Present Illness Almira Coaster Quant is a 55yoF who comes to [facility] on 05/08/21 for elective Rt hip THA with Dr. Cassell Smiles. PTA pt was still working, AMB as needed with severe pain but no device, no falls. Pt lives at home with husband 1 floor home, steps to enter. ? ?  ?PT Comments  ? ? Pt was supine in bed upon arriving. New dorsiflex assist brace in room with supportive husband at bedside. She is extremely cooperative and does endorse 9/10 pain currently. MD did change pain meds due to pt having rough night vomiting last night. " I just feel awful from lack of sleep and pain. Did agree to performing OOB activity and to address new orthotic, foot drop concerns. Pt was able to ambulate but due to pain, elected to not ambulate as far as earlier session. She is safe with ambulation but does continue to present with foot drop even with dorsiflex assist orthotic. Author will re-assess in the morning if more ridged AFO will be required. Pt is doing well overall but will benefit from OP PT at DC to progress to PLOF while maximizing independence with all ADLs.  ?  ?Recommendations for follow up therapy are one component of a multi-disciplinary discharge planning process, led by the attending physician.  Recommendations may be updated based on patient status, additional functional criteria and insurance authorization. ? ?Follow Up Recommendations ? Outpatient PT (per chart review, no HHPT insurance benifits) ?  ?  ?Assistance Recommended at Discharge PRN  ?Patient can return home with the following A little help with walking and/or transfers;A little help with bathing/dressing/bathroom;Assistance with cooking/housework;Assist for transportation;Help with stairs or ramp for entrance ?  ?Equipment Recommendations ? Rolling walker (2 wheels)  ?  ?   ?Precautions / Restrictions  Precautions ?Precautions: None;Fall;Anterior Hip ?Precaution Booklet Issued: Yes (comment) ?Precaution Comments: R foot drop still present ?Restrictions ?Weight Bearing Restrictions: Yes ?RLE Weight Bearing: Weight bearing as tolerated  ?  ? ?Mobility ? Bed Mobility ?Overal bed mobility: Modified Independent ?  ?  ?Transfers ?Overall transfer level: Needs assistance ?Equipment used: Rolling walker (2 wheels) ?Transfers: Sit to/from Stand ?Sit to Stand: Supervision ?   ?General transfer comment: no physical assistance to stand form EOB ?  ? ?Ambulation/Gait ?Ambulation/Gait assistance: Supervision ?Gait Distance (Feet): 45 Feet ?Assistive device: Rolling walker (2 wheels) ?Gait Pattern/deviations: Step-to pattern, Antalgic (foot drop) ?Gait velocity: decreased ?  ?  ?General Gait Details: Pt continues to be able to ambulate without physical assistance. used new orthotic for foot drop however pt still having to hip hike + excessive knee flex to advance RLE. Will trial more ridgid AFO next session(AM) to see if pt has improve gait kinematics. ? ?  ?Balance Overall balance assessment: Needs assistance ?Sitting-balance support: No upper extremity supported, Feet supported ?Sitting balance-Leahy Scale: Good ?  ?  ?Standing balance support: No upper extremity supported, During functional activity ?Standing balance-Leahy Scale: Fair ?Standing balance comment: fair dynamic, good static ?  ?  ?  ?Cognition Arousal/Alertness: Awake/alert ?Behavior During Therapy: [facility] for tasks assessed/performed ?Overall Cognitive Status: Within Functional Limits for tasks assessed ?  ?   ?General Comments: Pt continues to endorse anxiety and fear about foot drop. Did have dorsi-flex assist orthotic in room. ?  ?  ? ?  ?   ?   ? ?Pertinent Vitals/Pain Pain Assessment ?Pain Assessment: 0-10 ?Pain  Score: 9  ?Faces Pain Scale: Hurts whole lot ?Pain Location: R hip ?Pain Descriptors / Indicators: Aching, Grimacing ?Pain Intervention(s): Limited  activity within patient's tolerance, Monitored during session, Premedicated before session, Repositioned, Ice applied  ? ? ? ?PT Goals (current goals can now be found in the care plan section) Acute Rehab PT Goals ?Patient Stated Goal: get better and go home ?Progress towards PT goals: Progressing toward goals ? ?  ?Frequency ? ? ? BID ? ? ? ?  ?PT Plan Current plan remains appropriate  ? ? ?   ?AM-PAC PT "6 Clicks" Mobility   ?Outcome Measure ? Help needed turning from your back to your side while in a flat bed without using bedrails?: None ?Help needed moving from lying on your back to sitting on the side of a flat bed without using bedrails?: None ?Help needed moving to and from a bed to a chair (including a wheelchair)?: A Little ?Help needed standing up from a chair using your arms (e.g., wheelchair or bedside chair)?: None ?Help needed to walk in hospital room?: A Little ?Help needed climbing 3-5 steps with a railing? : A Little ?6 Click Score: 21 ? ?  ?End of Session Equipment Utilized During Treatment: Gait belt ?Activity Tolerance: Patient tolerated treatment well ?Patient left: in chair;with call bell/phone within reach;with nursing/sitter in room ?Nurse Communication: Mobility status ?PT Visit Diagnosis: Difficulty in walking, not elsewhere classified (R26.2);Other abnormalities of gait and mobility (R26.89) ?  ? ? ?Time: 0093-8182 ?PT Time Calculation (min) (ACUTE ONLY): 30 min ? ?Charges:  $Gait Training: 8-22 mins ?$Therapeutic Activity: 8-22 mins          ?          ? ?Jetta Lout PTA ?05/09/21, 2:20 PM  ? ?

## 2021-05-09 NOTE — Discharge Instructions (Signed)

## 2021-05-09 NOTE — Progress Notes (Cosign Needed)
Patient is not able to walk the distance required to go the bathroom, or he/she is unable to safely negotiate stairs required to access the bathroom.  A 3in1 BSC will alleviate this problem  

## 2021-05-09 NOTE — Progress Notes (Signed)
Orthopedic Tech Progress Note ?Patient Details:  ?Laura Adams ?10/24/1965 ?[id number] ?Called order into [employer] for AFO ?Patient ID: Laura Adams, female   DOB: 04-13-1965, 56 y.o.   MRN: [id number] ? ?Laura Adams ?05/09/2021, 10:48 AM ? ?

## 2021-05-09 NOTE — Discharge Summary (Deleted)
Physician Discharge Summary  ?Patient ID: ?Laura Adams ?MRN: [id number] ?DOB/AGE: 1965/07/22 56 y.o. ? ?Admit date: 05/08/2021 ?Discharge date: 05/09/2021 ? ?Admission Diagnoses:  ?M16.11 Unilateral primary osteoarthritis, right hip ?History of total hip replacement, right ? ?Discharge Diagnoses:  ?M16.11 Unilateral primary osteoarthritis, right hip ?Principal Problem: ?  History of total hip replacement, right ? ? ?Past Medical History:  ?Diagnosis Date  ? GERD (gastroesophageal reflux disease)   ? History of kidney stones   ? Kidney stone   ? PONV (postoperative nausea and vomiting)   ? ? ?Surgeries: Procedure(s): ?TOTAL HIP ARTHROPLASTY ANTERIOR APPROACH on 05/08/2021 ?  ?Consultants (if any):  ? ?Discharged Condition: Improved ? ?Hospital Course: Laura Adams is an 56 y.o. 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   ? ?She was given perioperative antibiotics:  ?Anti-infectives (From admission, onward)  ? ? Start     Dose/Rate Route Frequency Ordered Stop  ? 05/08/21 1930  vancomycin (VANCOCIN) IVPB 1000 mg/200 mL premix       ? 1,000 mg ?200 mL/hr over 60 Minutes Intravenous Every 12 hours 05/08/21 1144 05/08/21 2105  ? 05/08/21 0745  vancomycin (VANCOCIN) IVPB 1000 mg/200 mL premix       ? 1,000 mg ?200 mL/hr over 60 Minutes Intravenous  Once 05/08/21 0730 05/08/21 0825  ? 05/08/21 0627  ceFAZolin (ANCEF) 2-4 GM/100ML-% IVPB       ?Note to Pharmacy: Desma Paganini S: cabinet override  ?    05/08/21 0627 05/08/21 2104  ? 05/08/21 0600  ceFAZolin (ANCEF) IVPB 2g/100 mL premix       ? 2 g ?200 mL/hr over 30 Minutes Intravenous On call to O.R. 05/07/21 2204 05/08/21 0825  ? ?  ?. ? ?She was given sequential compression devices, early ambulation, and aspirin 81 mg twice daily for 30 days for DVT prophylaxis. ? ?She benefited  maximally from the hospital stay and there were no complications.   ? ?Recent vital signs:  ?Vitals:  ? 05/08/21 2015 05/09/21 0454  ?BP: 140/74 (!) 148/81  ?Pulse: 95 95  ?Resp: 16 16  ?Temp: 98.5 ?F (36.9 ?C) 98.2 ?F (36.8 ?C)  ?SpO2: 96% 100%  ? ? ?Recent laboratory studies:  ?Lab Results  ?Component Value Date  ? HGB 14.8 04/29/2021  ? HGB 11.5 (L) 05/08/2020  ? HGB 12.6 05/07/2020  ? ?Lab Results  ?Component Value Date  ? WBC 11.9 (H) 04/29/2021  ? PLT 440 (H) 04/29/2021  ? ?Lab Results  ?Component Value Date  ? INR 1.0 05/07/2020  ? ?Lab Results  ?Component Value Date  ? NA 139 04/29/2021  ? K 3.9 04/29/2021  ? CL 106 04/29/2021  ? CO2 22 04/29/2021  ? BUN 13 04/29/2021  ? CREATININE 0.73 04/29/2021  ? GLUCOSE 81 04/29/2021  ? ? ?Discharge Medications:   ?Allergies as of 05/09/2021   ?No Known Allergies ?  ? ?  ?Medication List  ?  ? ?STOP taking these medications   ? ?aspirin EC 81 MG tablet ?Replaced by: aspirin 81 MG chewable tablet ?  ? ?  ? ?TAKE these medications   ? ?acetaminophen 500 MG tablet ?Commonly known as: TYLENOL ?Take 1,000 mg by mouth every 6 (six) hours as needed for moderate pain. ?  ?aspirin 81 MG chewable tablet ?Chew 1 tablet (81 mg total)  by mouth 2 (two) times daily. ?Replaces: aspirin EC 81 MG tablet ?  ?celecoxib 200 MG capsule ?Commonly known as: CELEBREX ?Take 200 mg by mouth 2 (two) times daily. ?  ?cephALEXin 500 MG capsule ?Commonly known as: Keflex ?Take 1 capsule (500 mg total) by mouth 2 (two) times daily. ?  ?cetirizine 10 MG tablet ?Commonly known as: ZYRTEC ?Take 10 mg by mouth daily. ?  ?docusate sodium 100 MG capsule ?Commonly known as: COLACE ?Take 100 mg by mouth 2 (two) times daily. ?  ?fluticasone 50 MCG/ACT nasal spray ?Commonly known as: FLONASE ?Place 1 spray into both nostrils in the morning. ?  ?gemfibrozil 600 MG tablet ?Commonly known as: LOPID ?Take 600 mg by mouth 2 (two) times daily. ?  ?HYDROmorphone 2 MG tablet ?Commonly known as: DILAUDID ?Take 1 tablet  (2 mg total) by mouth every 4 (four) hours as needed for moderate pain (pains score 4-6). ?What changed: reasons to take this ?  ?ICY HOT EX ?Apply 1 application. topically daily as needed (pain). ?  ?multivitamin with minerals Tabs tablet ?Take 1 tablet by mouth daily. ?  ?niacin 500 MG CR tablet ?Commonly known as: NIASPAN ?Take 500 mg by mouth every evening. ?  ?omeprazole 20 MG capsule ?Commonly known as: PRILOSEC ?Take 20 mg by mouth daily. ?  ?tamsulosin 0.4 MG Caps capsule ?Commonly known as: FLOMAX ?Take 1 capsule (0.4 mg total) by mouth daily. ?  ? ?  ? ?  ?  ? ? ?  ?Durable Medical Equipment  ?(From admission, onward)  ?  ? ? ?  ? ?  Start     Ordered  ? 05/09/21 0706  For home use only DME Walker rolling  Once       ?Question Answer Comment  ?Walker: With 5 Inch Wheels   ?Patient needs a walker to treat with the following condition Osteoarthritis of right hip   ?  ? 05/09/21 0705  ? 05/09/21 0705  For home use only DME 3 n 1  Once       ? 05/09/21 0705  ? 05/08/21 1145  DME Walker rolling  Once       ?Question:  Patient needs a walker to treat with the following condition  Answer:  History of total hip replacement, right  ? 05/08/21 1144  ? 05/08/21 1145  DME 3 n 1  Once       ? 05/08/21 1144  ? 05/08/21 1145  DME Bedside commode  Once       ?Question:  Patient needs a bedside commode to treat with the following condition  Answer:  History of total hip replacement, right  ? 05/08/21 1144  ? ?  ?  ? ?  ? ? ?Diagnostic Studies: DG C-Arm 1-60 Min-No Report ? ?Result Date: 05/08/2021 ?Fluoroscopy was utilized by the requesting physician.  No radiographic interpretation.  ? ?DG HIP UNILAT WITH PELVIS 1V RIGHT ? ?Result Date: 05/08/2021 ?CLINICAL DATA:  Right hip arthroplasty EXAM: DG HIP (WITH OR WITHOUT PELVIS) 1V RIGHT COMPARISON:  CT abdomen/pelvis 04/20/2020 FINDINGS: Three C-arm fluoroscopic images were obtained intraoperatively and submitted for post operative interpretation. Initial image demonstrates  surgical hardware projecting over the femoral neck. There is marked degenerative change of the right hip. Subsequent images demonstrate postsurgical changes reflecting right hip arthroplasty. Hardware alignment is within expected limits, without evidence of immediate complication. Degenerative changes are also noted about the left hip. The symphysis pubis is intact. Fluoro time 12 seconds. Please  see the performing provider's procedural report for further detail. IMPRESSION: Intraoperative images during right hip arthroplasty as above without evidence of immediate complication. Electronically Signed   By: Lesia HausenPeter  Noone M.D.   On: 05/08/2021 09:13   ? ?Disposition: Discharge disposition: 01-Home or Self Care ? ? ? ? ? ? ? ? ? ? ? ?Signed: ?Altamese CabalMaurice Tanny Harnack ,PA-C ?05/09/2021, 7:06 AM ? ?  ?

## 2021-05-09 NOTE — Progress Notes (Signed)
Physical Therapy Treatment ?Patient Details ?Name: Laura Adams ?MRN: [id number] ?DOB: December 13, 1965 ?Today's Date: 05/09/2021 ? ? ?History of Present Illness Almira Coaster Daw is a 55yoF who comes to [facility] on 05/08/21 for elective Rt hip THA with Dr. Cassell Smiles. PTA pt was still working, AMB as needed with severe pain but no device, no falls. Pt lives at home with husband 1 floor home, steps to enter. ? ?  ?PT Comments  ? ? Pt ready for session.  Voiced concerns regarding continued foot drop.  Taught stretching techniques for post LE to prevent tightness and AAROM with gait belt.  Participated in exercises as described below. Pt had voiced concerns and wanting to ask ortho MD questions in OT session and she reached out to MD via secure chat.  She is able to get to EOB with UE assist of LE.  Is able to walk to PT gym for step training with 1 and 2 rails.  Walked 50' back to room before requesting chair to return.  ? ?Husband in for session.  Education provided and given gait belt for pt comfort as she remains generally nervous about mobility despite safe steady gait.  Foot drop is still present but she is able to accommodate by increasing hip flexion and is aware of foot position when placing foot.  No LOB or bucking noted.  Husband educated on safe guarding and awareness on safety/cues if needed. ? ?OPPT remains appropriate if HHPT is not available to her.   ?  ?Recommendations for follow up therapy are one component of a multi-disciplinary discharge planning process, led by the attending physician.  Recommendations may be updated based on patient status, additional functional criteria and insurance authorization. ? ?Follow Up Recommendations ? Outpatient PT (unable to receive HHPT due to limitations in payer source) ?  ?  ?Assistance Recommended at Discharge Intermittent Supervision/Assistance  ?Patient can return home with the following A little help with walking and/or transfers;A little help with  bathing/dressing/bathroom;Assistance with cooking/housework;Assist for transportation;Help with stairs or ramp for entrance ?  ?Equipment Recommendations ? Rolling walker (2 wheels)  ?  ?Recommendations for Other Services   ? ? ?  ?Precautions / Restrictions Precautions ?Precautions: None;Fall;Anterior Hip ?Precaution Booklet Issued: Yes (comment) ?Precaution Comments: R foot drop still present ?Restrictions ?Weight Bearing Restrictions: Yes ?RLE Weight Bearing: Weight bearing as tolerated  ?  ? ?Mobility ? Bed Mobility ?Overal bed mobility: Modified Independent ?  ?  ?  ?  ?  ?  ?General bed mobility comments: OOB with UE  assist, back to bed with hooking technique ?  ? ?Transfers ?Overall transfer level: Needs assistance ?Equipment used: Rolling walker (2 wheels) ?Transfers: Sit to/from Stand ?Sit to Stand: Supervision ?  ?  ?  ?  ?  ?  ?  ? ?Ambulation/Gait ?Ambulation/Gait assistance: Supervision, Min guard ?Gait Distance (Feet): 160 Feet ?  ?Gait Pattern/deviations: Step-to pattern ?Gait velocity: decreased ?  ?  ?General Gait Details: 3-point gait pattern with ability to move foot without turning sock today but foot drop still evident ? ? ?Stairs ?  ?  ?  ?  ?  ? ? ?Wheelchair Mobility ?  ? ?Modified Rankin (Stroke Patients Only) ?  ? ? ?  ?Balance Overall balance assessment: Needs assistance ?Sitting-balance support: No upper extremity supported, Feet supported ?Sitting balance-Leahy Scale: Good ?  ?  ?Standing balance support: No upper extremity supported, During functional activity ?Standing balance-Leahy Scale: Fair ?Standing balance comment: fair dynamic, good static ?  ?  ?  ?  ?  ?  ?  ?  ?  ?  ?  ?  ? ?  ?  Cognition Arousal/Alertness: Awake/alert ?Behavior During Therapy: [facility] for tasks assessed/performed ?Overall Cognitive Status: Within Functional Limits for tasks assessed ?  ?  ?  ?  ?  ?  ?  ?  ?  ?  ?  ?  ?  ?  ?  ?  ?  ?  ?  ? ?  ?Exercises Total Joint Exercises ?Ankle Circles/Pumps: Both, 10 reps,  Supine (lacks ankle DF activation on surgical side) ?Quad Sets: AROM, Right, 5 reps, Supine ?Heel Slides: AROM, Right, 5 reps, Supine ?Hip ABduction/ADduction: AROM, Right, 5 reps, Supine ?Long Arc Quad: AROM, Right, 5 reps, Supine ? ?  ?General Comments   ?  ?  ? ?Pertinent Vitals/Pain Pain Assessment ?Pain Assessment: Faces ?Faces Pain Scale: Hurts little more ?Pain Location: R hip ?Pain Descriptors / Indicators: Aching, Grimacing ?Pain Intervention(s): Limited activity within patient's tolerance, Monitored during session, Premedicated before session, Repositioned  ? ? ?Home Living Family/patient expects to be discharged to:: Private residence ?Living Arrangements: Spouse/significant other ?Available Help at Discharge: Family ?Type of Home: House ?Home Access: Stairs to enter ?Entrance Stairs-Rails: Can reach both ?Entrance Stairs-Number of Steps: 3 ?  ?Home Layout: One level ?Home Equipment: BSC/3in1 ?Additional Comments: variety of DME from family members  ?  ?Prior Function    ?  ?  ?   ? ?PT Goals (current goals can now be found in the care plan section) Progress towards PT goals: Progressing toward goals ? ?  ?Frequency ? ? ? BID ? ? ? ?  ?PT Plan    ? ? ?Co-evaluation   ?  ?  ?  ?  ? ?  ?AM-PAC PT "6 Clicks" Mobility   ?Outcome Measure ? Help needed turning from your back to your side while in a flat bed without using bedrails?: None ?Help needed moving from lying on your back to sitting on the side of a flat bed without using bedrails?: None ?Help needed moving to and from a bed to a chair (including a wheelchair)?: A Little ?Help needed standing up from a chair using your arms (e.g., wheelchair or bedside chair)?: None ?Help needed to walk in hospital room?: A Little ?Help needed climbing 3-5 steps with a railing? : A Little ?6 Click Score: 21 ? ?  ?End of Session Equipment Utilized During Treatment: Gait belt ?Activity Tolerance: Patient tolerated treatment well;No increased pain;Treatment limited  secondary to medical complications (Comment) ?Patient left: in chair;with call bell/phone within reach;with nursing/sitter in room ?Nurse Communication: Mobility status ?PT Visit Diagnosis: Difficulty in walking, not elsewhere classified (R26.2);Other abnormalities of gait and mobility (R26.89) ?  ? ? ?Time: 6606-3016 ?PT Time Calculation (min) (ACUTE ONLY): 27 min ? ?Charges:  $Gait Training: 8-22 mins ?$Therapeutic Exercise: 8-22 mins          ?         Danielle Dess, PTA ?05/09/21, 9:46 AM ? ?

## 2021-05-09 NOTE — Progress Notes (Signed)
Patient refused morphine due to allergy, but it was not listed under allergies ?

## 2021-05-09 NOTE — Progress Notes (Addendum)
Met with the patient in the room at the bedside ?The patient lives at Home with Her husband ?The patient  currently has DME rollator, BSC was her father in laws and is broken ?The patient will need a rolling walker and a 3 in 1 to be delivered by [employer] to the bedside ?They have transportation with her husband ?They can afford their medication ? ?They are not set up with Home health services due to Ins payer, She will get outpatient PT set up by Dr Harlow Mares office ? ? ?Admitted for: Hip replacement ? ?

## 2021-05-09 NOTE — Progress Notes (Signed)
Pt developed foot drop post-operatively. MD made aware by occupational therapist ?

## 2021-05-09 NOTE — Plan of Care (Signed)
  Problem: Health Behavior/Discharge Planning: Goal: Ability to manage health-related needs will improve Outcome: Progressing   Problem: Activity: Goal: Risk for activity intolerance will decrease Outcome: Progressing   

## 2021-05-09 NOTE — Evaluation (Signed)
Occupational Therapy Evaluation ?Patient Details ?Name: Laura Adams ?MRN: [id number] ?DOB: 1965/09/20 ?Today's Date: 05/09/2021 ? ? ?History of Present Illness Almira Coaster Castillo is a 55yoF who comes to [facility] on 05/08/21 for elective Rt hip THA with Dr. Cassell Smiles. PTA pt was still working, AMB as needed with severe pain but no device, no falls. Pt lives at home with husband 1 floor home, steps to enter.  ? ?Clinical Impression ?  ?Pt seen for OT evaluation this date, POD#1 from above surgery. Pt was independent in all ADL prior to surgery and is eager to return to PLOF with less pain and improved safety and independence. Pt currently requires PRN assist for LB ADL tasks due to R hip pain and continued R foot drop, SBA-CGA for ADL transfers and mobility using RW. Pt demonstrates good awareness of R foot drop and able to pick up RLE enough to prevent dragging. Pt educated in home/routines modifications, falls prevention, AE/DME for LB ADL, bed mobility techniques, and reassurance as pt expresses concerns regarding R foot drop ; handout provided to support recall and carryover. Pt verbalized understanding. Pt would benefit from additional instruction in self care skills and techniques to help maintain precautions with or without assistive devices to support recall and carryover prior to discharge. Do not anticipate skilled OT needs upon discharge.   ? ?Recommendations for follow up therapy are one component of a multi-disciplinary discharge planning process, led by the attending physician.  Recommendations may be updated based on patient status, additional functional criteria and insurance authorization.  ? ?Follow Up Recommendations ? No OT follow up  ?  ?Assistance Recommended at Discharge PRN  ?Patient can return home with the following A little help with walking and/or transfers;A little help with bathing/dressing/bathroom;Assistance with cooking/housework;Assist for transportation;Help with stairs or ramp for  entrance ? ?  ?Functional Status Assessment ? Patient has had a recent decline in their functional status and demonstrates the ability to make significant improvements in function in a reasonable and predictable amount of time.  ?Equipment Recommendations ? Other (comment) (2WW)  ?  ?Recommendations for Other Services   ? ? ?  ?Precautions / Restrictions Precautions ?Precautions: None;Fall;Anterior Hip ?Precaution Booklet Issued: Yes (comment) ?Precaution Comments: R foot drop still present ?Restrictions ?Weight Bearing Restrictions: Yes ?RLE Weight Bearing: Weight bearing as tolerated  ? ?  ? ?Mobility Bed Mobility ?Overal bed mobility: Modified Independent ?  ?  ?  ?  ?  ?  ?General bed mobility comments: pt educated in hooking LLE behind RLE if needed to assist with getting legs back in bed ?  ? ?Transfers ?Overall transfer level: Needs assistance ?Equipment used: Rolling walker (2 wheels) ?Transfers: Sit to/from Stand ?Sit to Stand: Min guard ?  ?  ?  ?  ?  ?  ?  ? ?  ?Balance Overall balance assessment: Needs assistance ?Sitting-balance support: No upper extremity supported, Feet supported ?Sitting balance-Leahy Scale: Good ?  ?  ?Standing balance support: No upper extremity supported, During functional activity ?Standing balance-Leahy Scale: Fair ?Standing balance comment: fair dynamic, good static ?  ?  ?  ?  ?  ?  ?  ?  ?  ?  ?  ?   ? ?ADL either performed or assessed with clinical judgement  ? ?ADL   ?  ?  ?  ?  ?  ?  ?  ?  ?  ?  ?  ?  ?  ?  ?  ?  ?  ?  ?  ?  General ADL Comments: Pt currently requires PRN assist for LB ADL tasks due to R hip pain and continued R foot drop, SBA-CGA for ADL transfers and mobility using RW. Pt demonstrates good awareness of R foot drop and able to pick up RLE enough to prevent dragging.  ? ? ? ?Vision   ?   ?   ?Perception   ?  ?Praxis   ?  ? ?Pertinent Vitals/Pain Pain Assessment ?Pain Assessment: 0-10 ?Pain Score: 6  ?Pain Location: R hip ?Pain Descriptors / Indicators:  Aching ?Pain Intervention(s): Limited activity within patient's tolerance, Monitored during session, Repositioned, Patient requesting pain meds-RN notified, RN gave pain meds during session  ? ? ? ?Hand Dominance Left ?  ?Extremity/Trunk Assessment Upper Extremity Assessment ?Upper Extremity Assessment: Overall WFL for tasks assessed ?  ?Lower Extremity Assessment ?Lower Extremity Assessment: RLE deficits/detail ?RLE Deficits / Details: has active ankle PF, toe PF, knee extension, hip flexion, hip extension; No trace ankle dorsiflexion, no trace toe dorsiflexion. Has normal proprioception at 5 toes. Has sensation of upper lower leg, but tingling in Rt foot. - continued from POD0 ?  ?  ?  ?Communication Communication ?Communication: No difficulties ?  ?Cognition Arousal/Alertness: Awake/alert ?Behavior During Therapy: [facility] for tasks assessed/performed ?Overall Cognitive Status: Within Functional Limits for tasks assessed ?  ?  ?  ?  ?  ?  ?  ?  ?  ?  ?  ?  ?  ?  ?  ?  ?General Comments: pt expressed concern about R foot drop, MD notified ?  ?  ?General Comments    ? ?  ?Exercises Other Exercises ?Other Exercises: Pt educated in home/routines modifications, falls prevention, AE/DME for LB ADL, bed mobility techniques, and reassurance as pt expresses concerns regarding R foot drop ; handout provided to support recall and carryover ?  ?Shoulder Instructions    ? ? ?Home Living Family/patient expects to be discharged to:: Private residence ?Living Arrangements: Spouse/significant other ?Available Help at Discharge: Family ?Type of Home: House ?Home Access: Stairs to enter ?Entrance Stairs-Number of Steps: 3 ?Entrance Stairs-Rails: Can reach both ?Home Layout: One level ?  ?  ?  ?  ?  ?  ?  ?Home Equipment: BSC/3in1 ?  ?Additional Comments: variety of DME from family members ?  ? ?  ?Prior Functioning/Environment Prior Level of Function : Independent/Modified Independent ?  ?  ?  ?  ?  ?  ?  ?  ?  ? ?  ?  ?OT Problem List:  Decreased strength;Decreased range of motion;Impaired sensation;Pain;Impaired balance (sitting and/or standing);Decreased knowledge of use of DME or AE ?  ?   ?OT Treatment/Interventions: Self-care/ADL training;Therapeutic exercise;Therapeutic activities;DME and/or AE instruction;Patient/family education;Balance training;Neuromuscular education  ?  ?OT Goals(Current goals can be found in the care plan section) Acute Rehab OT Goals ?Patient Stated Goal: get better and for R foot drop to resolve ?OT Goal Formulation: With patient ?Time For Goal Achievement: 05/23/21 ?Potential to Achieve Goals: Good ?ADL Goals ?Pt Will Perform Lower Body Dressing: with modified independence ?Pt Will Transfer to Toilet: with modified independence;ambulating (LRAD) ?Pt Will Perform Toileting - Clothing Manipulation and hygiene: with modified independence;sitting/lateral leans ?Additional ADL Goal #1: Pt will verbalize plan to implement at least 1 learned falls prevention strategy due to R foot drop to maximize safety with ADL/IADL.  ?OT Frequency: Min 2X/week ?  ? ?Co-evaluation   ?  ?  ?  ?  ? ?  ?AM-PAC OT "6 Clicks"  Daily Activity     ?Outcome Measure Help from another person eating meals?: None ?Help from another person taking care of personal grooming?: None ?Help from another person toileting, which includes using toliet, bedpan, or urinal?: A Little ?Help from another person bathing (including washing, rinsing, drying)?: A Little ?Help from another person to put on and taking off regular upper body clothing?: None ?Help from another person to put on and taking off regular lower body clothing?: A Little ?6 Click Score: 21 ?  ?End of Session Equipment Utilized During Treatment: Gait belt;Rolling walker (2 wheels) ?Nurse Communication:  (notified MD and PA regarding her R foot drop) ? ?Activity Tolerance: Patient tolerated treatment well ?Patient left: in bed;with call bell/phone within reach;with bed alarm set ? ?OT Visit  Diagnosis: Other abnormalities of gait and mobility (R26.89);Pain;Muscle weakness (generalized) (M62.81) ?Pain - Right/Left: Right ?Pain - part of body: Hip  ?              ?Time: [id number] ?OT Time Calculation (min): 22 mi

## 2021-05-10 DIAGNOSIS — M1611 Unilateral primary osteoarthritis, right hip: Secondary | ICD-10-CM | POA: Diagnosis not present

## 2021-05-10 LAB — SURGICAL PATHOLOGY

## 2021-05-10 MED ORDER — OXYCODONE HCL 5 MG PO TABS
5.0000 mg | ORAL_TABLET | ORAL | 0 refills | Status: DC | PRN
Start: 2021-05-10 — End: 2021-07-04

## 2021-05-10 NOTE — Discharge Summary (Signed)
Physician Discharge Summary  ?Patient ID: ?Laura Adams ?MRN: [id number] ?DOB/AGE: 1965/05/09 56 y.o. ? ?Admit date: 05/08/2021 ?Discharge date: 05/10/2021 ? ?Admission Diagnoses:  ?M16.11 Unilateral primary osteoarthritis, right hip ?History of total hip replacement, right ? ?Discharge Diagnoses:  ?M16.11 Unilateral primary osteoarthritis, right hip ?Principal Problem: ?  History of total hip replacement, right ? ? ?Past Medical History:  ?Diagnosis Date  ? GERD (gastroesophageal reflux disease)   ? History of kidney stones   ? Kidney stone   ? PONV (postoperative nausea and vomiting)   ? ? ?Surgeries: Procedure(s): ?TOTAL HIP ARTHROPLASTY ANTERIOR APPROACH on 05/08/2021 ?  ?Consultants (if any):  ? ?Discharged Condition: Improved ? ?Hospital Course: Laura Adams is an 56 y.o. 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   ? ?She was given perioperative antibiotics:  ?Anti-infectives (From admission, onward)  ? ? Start     Dose/Rate Route Frequency Ordered Stop  ? 05/08/21 1930  vancomycin (VANCOCIN) IVPB 1000 mg/200 mL premix       ? 1,000 mg ?200 mL/hr over 60 Minutes Intravenous Every 12 hours 05/08/21 1144 05/09/21 0756  ? 05/08/21 0745  vancomycin (VANCOCIN) IVPB 1000 mg/200 mL premix       ? 1,000 mg ?200 mL/hr over 60 Minutes Intravenous  Once 05/08/21 0730 05/08/21 0825  ? 05/08/21 0627  ceFAZolin (ANCEF) 2-4 GM/100ML-% IVPB       ?Note to Pharmacy: Trudie Reed S: cabinet override  ?    05/08/21 0627 05/08/21 2104  ? 05/08/21 0600  ceFAZolin (ANCEF) IVPB 2g/100 mL premix       ? 2 g ?200 mL/hr over 30 Minutes Intravenous On call to O.R. 05/07/21 2204 05/08/21 0825  ? ?  ?. ? ?She was given sequential compression devices, early ambulation, and aspirin for DVT prophylaxis. ? ?She benefited maximally from the hospital stay and  there were no complications.   ? ?Recent vital signs:  ?Vitals:  ? 05/10/21 0325 05/10/21 1011  ?BP: 134/81 135/79  ?Pulse: (!) 103 (!) 105  ?Resp: 15 17  ?Temp: 98.4 ?F (36.9 ?C) 99.4 ?F (37.4 ?C)  ?SpO2: 98% 99%  ? ? ?Recent laboratory studies:  ?Lab Results  ?Component Value Date  ? HGB 14.8 04/29/2021  ? HGB 11.5 (L) 05/08/2020  ? HGB 12.6 05/07/2020  ? ?Lab Results  ?Component Value Date  ? WBC 11.9 (H) 04/29/2021  ? PLT 440 (H) 04/29/2021  ? ?Lab Results  ?Component Value Date  ? INR 1.0 05/07/2020  ? ?Lab Results  ?Component Value Date  ? NA 139 04/29/2021  ? K 3.9 04/29/2021  ? CL 106 04/29/2021  ? CO2 22 04/29/2021  ? BUN 13 04/29/2021  ? CREATININE 0.73 04/29/2021  ? GLUCOSE 81 04/29/2021  ? ? ?Discharge Medications:   ?Allergies as of 05/10/2021   ?No Known Allergies ?  ? ?  ?Medication List  ?  ? ?STOP taking these medications   ? ?aspirin EC 81 MG tablet ?Replaced by: aspirin 81 MG chewable tablet ?  ?HYDROmorphone 2 MG tablet ?Commonly known as: Dilaudid ?  ? ?  ? ?TAKE these medications   ? ?acetaminophen 500 MG tablet ?Commonly known as: TYLENOL ?Take 1,000 mg by mouth every 6 (six) hours as needed for moderate pain. ?  ?aspirin 81 MG chewable tablet ?Chew 1  tablet (81 mg total) by mouth 2 (two) times daily. ?Replaces: aspirin EC 81 MG tablet ?  ?celecoxib 200 MG capsule ?Commonly known as: CELEBREX ?Take 200 mg by mouth 2 (two) times daily. ?Notes to patient: Not given in hospital ?  ?cephALEXin 500 MG capsule ?Commonly known as: Keflex ?Take 1 capsule (500 mg total) by mouth 2 (two) times daily. ?Notes to patient: Not given in hospital ?  ?cetirizine 10 MG tablet ?Commonly known as: ZYRTEC ?Take 10 mg by mouth daily. ?Notes to patient: Not given in hospital ?  ?docusate sodium 100 MG capsule ?Commonly known as: COLACE ?Take 100 mg by mouth 2 (two) times daily. ?  ?fluticasone 50 MCG/ACT nasal spray ?Commonly known as: FLONASE ?Place 1 spray into both nostrils in the morning. ?  ?gemfibrozil 600 MG  tablet ?Commonly known as: LOPID ?Take 600 mg by mouth 2 (two) times daily. ?  ?ICY HOT EX ?Apply 1 application. topically daily as needed (pain). ?  ?multivitamin with minerals Tabs tablet ?Take 1 tablet by mouth daily. ?Notes to patient: Not given in hospital ?  ?niacin 500 MG CR tablet ?Commonly known as: NIASPAN ?Take 500 mg by mouth every evening. ?Notes to patient: Not given in hospital ?  ?omeprazole 20 MG capsule ?Commonly known as: PRILOSEC ?Take 20 mg by mouth daily. ?Notes to patient: Not given in hospital ?  ?oxyCODONE 5 MG immediate release tablet ?Commonly known as: Oxy IR/ROXICODONE ?Take 1-2 tablets (5-10 mg total) by mouth every 4 (four) hours as needed for moderate pain or severe pain. ?  ?tamsulosin 0.4 MG Caps capsule ?Commonly known as: FLOMAX ?Take 1 capsule (0.4 mg total) by mouth daily. ?  ? ?  ? ?  ?  ? ? ?  ?Durable Medical Equipment  ?(From admission, onward)  ?  ? ? ?  ? ?  Start     Ordered  ? 05/09/21 0706  For home use only DME Walker rolling  Once       ?Question Answer Comment  ?Walker: With 5 Inch Wheels   ?Patient needs a walker to treat with the following condition Osteoarthritis of right hip   ?  ? 05/09/21 0705  ? 05/09/21 0705  For home use only DME 3 n 1  Once       ? 05/09/21 0705  ? 05/08/21 1145  DME Walker rolling  Once       ?Question:  Patient needs a walker to treat with the following condition  Answer:  History of total hip replacement, right  ? 05/08/21 1144  ? 05/08/21 1145  DME 3 n 1  Once       ? 05/08/21 1144  ? 05/08/21 1145  DME Bedside commode  Once       ?Question:  Patient needs a bedside commode to treat with the following condition  Answer:  History of total hip replacement, right  ? 05/08/21 1144  ? ?  ?  ? ?  ? ? ?Diagnostic Studies: DG C-Arm 1-60 Min-No Report ? ?Result Date: 05/08/2021 ?Fluoroscopy was utilized by the requesting physician.  No radiographic interpretation.  ? ?DG HIP UNILAT WITH PELVIS 1V RIGHT ? ?Result Date: 05/08/2021 ?CLINICAL DATA:   Right hip arthroplasty EXAM: DG HIP (WITH OR WITHOUT PELVIS) 1V RIGHT COMPARISON:  CT abdomen/pelvis 04/20/2020 FINDINGS: Three C-arm fluoroscopic images were obtained intraoperatively and submitted for post operative interpretation. Initial image demonstrates surgical hardware projecting over the femoral neck. There is marked degenerative change  of the right hip. Subsequent images demonstrate postsurgical changes reflecting right hip arthroplasty. Hardware alignment is within expected limits, without evidence of immediate complication. Degenerative changes are also noted about the left hip. The symphysis pubis is intact. Fluoro time 12 seconds. Please see the performing provider's procedural report for further detail. IMPRESSION: Intraoperative images during right hip arthroplasty as above without evidence of immediate complication. Electronically Signed   By: Valetta Mole M.D.   On: 05/08/2021 09:13   ? ?Disposition: Discharge disposition: 01-Home or Self Care ? ? ? ? ? ? ? ? ? ? ? ?Signed: ?[name] ,PA-C ?05/10/2021, 1:52 PM ? ?  ?

## 2021-05-10 NOTE — Progress Notes (Signed)
Physical Therapy Treatment ?Patient Details ?Name: Laura Adams ?MRN: [id number] ?DOB: 02-Aug-1965 ?Today's Date: 05/10/2021 ? ? ?History of Present Illness Laura Adams is a 70yoF who comes to [facility] on 05/08/21 for elective Rt hip THA with Dr. Kurtis Bushman. PTA pt was still working, AMB as needed with severe pain but no device, no falls. Pt lives at home with husband 1 floor home, steps to enter. ? ?  ?PT Comments  ? ? Pt was sitting EOB requesting to go to BR upon arriving. She is endorsing feeling much better versus previous date. Pt does continue to present with R foot drop. Used more rigid AFO with much improved gait kinematics and safety. Pt demonstrates safe ability to stand and ambulate with RW. Easily able to ambulate without LOB. Reached out to MD for AFO order. Pt is doing well from a mobility standpoint. She is cleared from an acute PT standpoint to DC home with OP PT to follow. Do recommend carbon fiber AFO prior to DC. All other equipment needs are met. ?   ?Recommendations for follow up therapy are one component of a multi-disciplinary discharge planning process, led by the attending physician.  Recommendations may be updated based on patient status, additional functional criteria and insurance authorization. ? ?Follow Up Recommendations ? Outpatient PT ?  ?  ?Assistance Recommended at Discharge PRN  ?Patient can return home with the following A little help with walking and/or transfers;A little help with bathing/dressing/bathroom;Assistance with cooking/housework;Assist for transportation;Help with stairs or ramp for entrance ?  ?Equipment Recommendations ?   Pt has all equipment needs met  ?  ?   ?Precautions / Restrictions Precautions ?Precautions: None;Fall;Anterior Hip ?Precaution Booklet Issued: Yes (comment) ?Precaution Comments: R foot drop still present ?Restrictions ?Weight Bearing Restrictions: Yes ?RLE Weight Bearing: Weight bearing as tolerated  ?  ? ?Mobility ? Bed Mobility ?Overal bed  mobility: Modified Independent ?  ?Transfers ?Overall transfer level: Needs assistance ?Equipment used: Rolling walker (2 wheels) ?Transfers: Sit to/from Stand ?Sit to Stand: Supervision ? General transfer comment: no physical assistance required to stand from numerous surfaces heights ?  ? ?Ambulation/Gait ?Ambulation/Gait assistance: Supervision ?Gait Distance (Feet): 150 Feet ?Assistive device: Rolling walker (2 wheels) ?Gait Pattern/deviations: Step-through pattern ?Gait velocity: decreased ?  ?  ?General Gait Details: pt trials more ridgid AFO with much improved success. Reached out to prosthetic/orthotic fitter for AFO to be delivered ? ? ?  ?Balance Overall balance assessment: Needs assistance ?Sitting-balance support: No upper extremity supported, Feet supported ?Sitting balance-Leahy Scale: Good ?  ?  ?Standing balance support: During functional activity, Bilateral upper extremity supported ?Standing balance-Leahy Scale: Good ?  ?   ?Cognition Arousal/Alertness: Awake/alert ?Behavior During Therapy: [facility] for tasks assessed/performed ?Overall Cognitive Status: Within Functional Limits for tasks assessed ?  ?  ?   ?General Comments: Pt is A and O x 4 and endorsing feeling much better tahn previous date. ?  ?  ? ?  ?   ?General Comments General comments (skin integrity, edema, etc.): continued to discuss the importance of performing ther ex to promote return in RLE function. pt demonstartes understanding and is progressing well. ?  ?  ? ?Pertinent Vitals/Pain Pain Assessment ?Pain Assessment: 0-10 ?Pain Score: 4  ?Faces Pain Scale: Hurts a little bit ?Pain Location: R hip ?Pain Descriptors / Indicators: Aching, Grimacing ?Pain Intervention(s): Limited activity within patient's tolerance, Monitored during session, Premedicated before session, Repositioned, Ice applied  ? ? ? ?PT Goals (current goals can now be  found in the care plan section) Acute Rehab PT Goals ?Patient Stated Goal: get better and go  home ?Progress towards PT goals: Progressing toward goals ? ?  ?Frequency ? ? ? BID ? ? ? ?  ?PT Plan Current plan remains appropriate  ? ? ?   ?AM-PAC PT "6 Clicks" Mobility   ?Outcome Measure ? Help needed turning from your back to your side while in a flat bed without using bedrails?: None ?Help needed moving from lying on your back to sitting on the side of a flat bed without using bedrails?: None ?Help needed moving to and from a bed to a chair (including a wheelchair)?: A Little ?Help needed standing up from a chair using your arms (e.g., wheelchair or bedside chair)?: None ?Help needed to walk in hospital room?: A Little ?Help needed climbing 3-5 steps with a railing? : A Little ?6 Click Score: 21 ? ?  ?End of Session   ?Activity Tolerance: Patient tolerated treatment well ?Patient left: in chair;with call bell/phone within reach;with nursing/sitter in room ?Nurse Communication: Mobility status ?PT Visit Diagnosis: Difficulty in walking, not elsewhere classified (R26.2);Other abnormalities of gait and mobility (R26.89) ?  ? ? ?Time: 4688-7373 ?PT Time Calculation (min) (ACUTE ONLY): 28 min ? ?Charges:  $Gait Training: 8-22 mins ?$Therapeutic Activity: 8-22 mins          ?          ?Julaine Fusi PTA ?05/10/21, 8:36 AM  ? ?

## 2021-05-10 NOTE — Progress Notes (Signed)
Occupational Therapy Treatment ?Patient Details ?Name: Laura Adams ?MRN: [id number] ?DOB: March 10, 1965 ?Today's Date: 05/10/2021 ? ? ?History of present illness Laura Adams is a 46yoF who comes to [facility] on 05/08/21 for elective Rt hip THA with Dr. Kurtis Bushman. PTA pt was still working, AMB as needed with severe pain but no device, no falls. Pt lives at home with husband 1 floor home, steps to enter. ?  ?OT comments ? Upon entering the room, pt is agreeable to OT intervention. Plans for discharge home from hospital later today with pt being full dressed but needing assistance for B shoes. OT continued education on LB dressing techniques in order to increase independence in LB dressing. Pt stands with supervision and ambulates with min guard to bathroom. Min guard for toilet transfer as well as for balance with clothing management and hygiene. Pt making excellent progress and returns back to sit on EOB with family entering the room. All needs within reach.   ? ?Recommendations for follow up therapy are one component of a multi-disciplinary discharge planning process, led by the attending physician.  Recommendations may be updated based on patient status, additional functional criteria and insurance authorization. ?   ?Follow Up Recommendations ? No OT follow up  ?  ?Assistance Recommended at Discharge PRN  ?Patient can return home with the following ? A little help with walking and/or transfers;A little help with bathing/dressing/bathroom;Assistance with cooking/housework;Assist for transportation;Help with stairs or ramp for entrance ?  ?Equipment Recommendations ? Other (comment);BSC/3in1 (RW)  ?  ?   ?Precautions / Restrictions Precautions ?Precautions: Fall;Anterior Hip ?Precaution Booklet Issued: Yes (comment) ?Precaution Comments: R foot drop still present ?Restrictions ?Weight Bearing Restrictions: Yes ?RLE Weight Bearing: Weight bearing as tolerated  ? ? ?  ? ?Mobility Bed Mobility ?Overal bed mobility: Modified  Independent ?  ?  ?  ?  ?  ?  ?  ?  ? ?Transfers ?Overall transfer level: Needs assistance ?Equipment used: Rolling walker (2 wheels) ?Transfers: Sit to/from Stand ?Sit to Stand: Supervision ?  ?  ?  ?  ?  ?  ?  ?  ?Balance Overall balance assessment: Needs assistance ?Sitting-balance support: No upper extremity supported, Feet supported ?Sitting balance-Leahy Scale: Good ?  ?  ?Standing balance support: During functional activity, Bilateral upper extremity supported ?Standing balance-Leahy Scale: Fair ?  ?  ?  ?  ?  ?  ?  ?  ?  ?  ?  ?  ?   ? ?ADL either performed or assessed with clinical judgement  ? ?ADL Overall ADL's : Needs assistance/impaired ?  ?  ?  ?  ?  ?  ?  ?  ?  ?  ?Lower Body Dressing: Minimal assistance;Sit to/from stand ?Lower Body Dressing Details (indicate cue type and reason): to don B shoes ?Toilet Transfer: Min guard;Rolling walker (2 wheels);Regular Toilet;Ambulation ?  ?Toileting- Water quality scientist and Hygiene: Min guard;Sit to/from stand ?  ?  ?  ?Functional mobility during ADLs: Min guard;Rolling walker (2 wheels) ?  ?  ? ?Extremity/Trunk Assessment Upper Extremity Assessment ?Upper Extremity Assessment: Overall WFL for tasks assessed ?  ?  ?  ?  ?  ? ?Vision Patient Visual Report: No change from baseline ?  ?  ?   ?   ? ?Cognition Arousal/Alertness: Awake/alert ?Behavior During Therapy: [facility] for tasks assessed/performed ?Overall Cognitive Status: Within Functional Limits for tasks assessed ?  ?  ?  ?  ?  ?  ?  ?  ?  ?  ?  ?  ?  ?  ?  ?  ?  General Comments: Pt is A and O x 4 ?  ?  ?   ?   ?   ?   ? ? ?Pertinent Vitals/ Pain       Pain Assessment ?Pain Assessment: No/denies pain ? ?   ?   ? ?Frequency ? Min 2X/week  ? ? ? ? ?  ?Progress Toward Goals ? ?OT Goals(current goals can now be found in the care plan section) ? Progress towards OT goals: Progressing toward goals ? ?Acute Rehab OT Goals ?Patient Stated Goal: to get better and go home ?OT Goal Formulation: With patient ?Time For Goal  Achievement: 05/23/21 ?Potential to Achieve Goals: Good  ?Plan Discharge plan remains appropriate;Frequency remains appropriate   ? ?   ?AM-PAC OT "6 Clicks" Daily Activity     ?Outcome Measure ? ? Help from another person eating meals?: None ?Help from another person taking care of personal grooming?: None ?Help from another person toileting, which includes using toliet, bedpan, or urinal?: A Little ?Help from another person bathing (including washing, rinsing, drying)?: A Little ?Help from another person to put on and taking off regular upper body clothing?: None ?Help from another person to put on and taking off regular lower body clothing?: A Little ?6 Click Score: 21 ? ?  ?End of Session Equipment Utilized During Treatment: Rolling walker (2 wheels) ? ?OT Visit Diagnosis: Other abnormalities of gait and mobility (R26.89);Muscle weakness (generalized) (M62.81) ?  ?Activity Tolerance Patient tolerated treatment well ?  ?Patient Left in bed;with call bell/phone within reach;with bed alarm set ?  ?Nurse Communication   ?  ? ?   ? ?Time: 1359-1410 ?OT Time Calculation (min): 11 min ? ?Charges: OT General Charges ?$OT Visit: 1 Visit ?OT Treatments ?$Self Care/Home Management : 8-22 mins ?Darleen Crocker, [place], OTR/L , CBIS ?ascom [phone]  ?05/10/21, 2:15 PM  ?

## 2021-06-03 NOTE — H&P (Signed)
NAME: Laura Adams MRN:   [id number] DOB:   1965-07-05     HISTORY AND PHYSICAL  CHIEF COMPLAINT:  right hip pain  HISTORY:   Laura Adams a 56 y.o. female  with right  Hip Pain Patient complains of right hip pain. Onset of the symptoms was several years ago. Inciting event: known DJD. The patient reports the hip pain is worse with weight bearing. Associated symptoms: none. Aggravating symptoms include: any weight bearing, going up and down stairs, and inactivity. Patient has had no prior hip problems. Previous visits for this problem: multiple, this is a longstanding diagnosis. Last seen several weeks ago by me. Evaluation to date: plain films, which were abnormal  osteoarthritis . Treatment to date: OTC analgesics, which have been somewhat effective, prescription analgesics, which have been somewhat effective, home exercise program, which has been somewhat effective, and physical therapy, which has been somewhat effective.    Plan for right total hip replacement  PAST MEDICAL HISTORY:   Past Medical History:  Diagnosis Date   GERD (gastroesophageal reflux disease)    History of kidney stones    Kidney stone    PONV (postoperative nausea and vomiting)     PAST SURGICAL HISTORY:   Past Surgical History:  Procedure Laterality Date   COLONOSCOPY     CYSTOSCOPY WITH STENT PLACEMENT Left 04/20/2020   Procedure: CYSTOSCOPY WITH STENT PLACEMENT;  Surgeon: Lucas Mallow, MD;  Location: [facility] ORS;  Service: Urology;  Laterality: Left;   IR NEPHROSTOMY PLACEMENT LEFT  05/07/2020   NEPHROLITHOTOMY Left 05/07/2020   Procedure: NEPHROLITHOTOMY PERCUTANEOUS;  Surgeon: Billey Co, MD;  Location: [facility] ORS;  Service: Urology;  Laterality: Left;   TOTAL HIP ARTHROPLASTY Right 05/08/2021   Procedure: TOTAL HIP ARTHROPLASTY ANTERIOR APPROACH;  Surgeon: Lovell Sheehan, MD;  Location: [facility] ORS;  Service: Orthopedics;  Laterality: Right;    MEDICATIONS:   No medications prior to admission.     ALLERGIES:  No Known Allergies  REVIEW OF SYSTEMS:   Negative except HPI  FAMILY HISTORY:   Family History  Problem Relation Age of Onset   Breast cancer Neg Hx     SOCIAL HISTORY:   reports that she has never smoked. She has never used smokeless tobacco. She reports that she does not currently use alcohol. She reports that she does not use drugs.  PHYSICAL EXAM:  General appearance: alert, cooperative, and no distress Neck: no JVD and supple, symmetrical, trachea midline Resp: clear to auscultation bilaterally Cardio: regular rate and rhythm, S1, S2 normal, no murmur, click, rub or gallop GI: soft, non-tender; bowel sounds normal; no masses,  no organomegaly Extremities: extremities normal, atraumatic, no cyanosis or edema and Homans sign is negative, no sign of DVT Pulses: 2+ and symmetric Skin: Skin color, texture, turgor normal. No rashes or lesions Neurologic: Alert and oriented X 3, normal strength and tone. Normal symmetric reflexes. Normal coordination and gait    LABORATORY STUDIES: No results for input(s): WBC, HGB, HCT, PLT in the last 72 hours.  No results for input(s): NA, K, CL, CO2, GLUCOSE, BUN, CREATININE, CALCIUM in the last 72 hours.  STUDIES/RESULTS:  DG C-Arm 1-60 Min-No Report  Result Date: 05/08/2021 Fluoroscopy was utilized by the requesting physician.  No radiographic interpretation.   DG HIP UNILAT WITH PELVIS 1V RIGHT  Result Date: 05/08/2021 CLINICAL DATA:  Right hip arthroplasty EXAM: DG HIP (WITH OR WITHOUT PELVIS) 1V RIGHT COMPARISON:  CT abdomen/pelvis 04/20/2020 FINDINGS: Three C-arm fluoroscopic  images were obtained intraoperatively and submitted for post operative interpretation. Initial image demonstrates surgical hardware projecting over the femoral neck. There is marked degenerative change of the right hip. Subsequent images demonstrate postsurgical changes reflecting right hip arthroplasty. Hardware alignment is within expected limits,  without evidence of immediate complication. Degenerative changes are also noted about the left hip. The symphysis pubis is intact. Fluoro time 12 seconds. Please see the performing provider's procedural report for further detail. IMPRESSION: Intraoperative images during right hip arthroplasty as above without evidence of immediate complication. Electronically Signed   By: Valetta Mole M.D.   On: 05/08/2021 09:13    ASSESSMENT:  End stage osteoarthritis right hip        Principal Problem:   History of total hip replacement, right    PLAN:  Right Primary Total Hip   Laura Adams 06/03/2021. 8:13 AM

## 2021-07-04 ENCOUNTER — Encounter: Payer: Self-pay | Admitting: Urology

## 2021-07-04 ENCOUNTER — Ambulatory Visit
Admission: RE | Admit: 2021-07-04 | Discharge: 2021-07-04 | Disposition: A | Discharge status: Home / Self Care | Payer: Managed Care, Other (non HMO) | Attending: Urology | Admitting: Urology

## 2021-07-04 ENCOUNTER — Ambulatory Visit
Admission: RE | Admit: 2021-07-04 | Discharge: 2021-07-04 | Disposition: A | Discharge status: Home / Self Care | Payer: Managed Care, Other (non HMO) | Source: Ambulatory Visit | Attending: Urology | Admitting: Urology

## 2021-07-04 ENCOUNTER — Ambulatory Visit (INDEPENDENT_AMBULATORY_CARE_PROVIDER_SITE_OTHER): Payer: Managed Care, Other (non HMO) | Admitting: Urology

## 2021-07-04 VITALS — BP 135/89 | HR 79 | Ht 66.0 in | Wt 165.0 lb

## 2021-07-04 DIAGNOSIS — Z87442 Personal history of urinary calculi: Secondary | ICD-10-CM

## 2021-07-04 DIAGNOSIS — N200 Calculus of kidney: Secondary | ICD-10-CM

## 2021-07-04 DIAGNOSIS — Z09 Encounter for follow-up examination after completed treatment for conditions other than malignant neoplasm: Secondary | ICD-10-CM

## 2021-07-04 NOTE — Patient Instructions (Signed)
Dietary Guidelines to Help Prevent Kidney Stones Kidney stones are deposits of minerals and salts that form inside your kidneys. Your risk of developing kidney stones may be greater depending on your diet, your lifestyle, the medicines you take, and whether you have certain medical conditions. Most people can lower their chances of developing kidney stones by following the instructions below. Your dietitian may give you more specific instructions depending on your overall health and the type of kidney stones you tend to develop. What are tips for following this plan? Reading food labels  Choose foods with "no salt added" or "low-salt" labels. Limit your salt (sodium) intake to less than 1,500 mg a day. Choose foods with calcium for each meal and snack. Try to eat about 300 mg of calcium at each meal. Foods that contain 200-500 mg of calcium a serving include: 8 oz (237 mL) of milk, calcium-fortifiednon-dairy milk, and calcium-fortifiedfruit juice. Calcium-fortified means that calcium has been added to these drinks. 8 oz (237 mL) of kefir, yogurt, and soy yogurt. 4 oz (114 g) of tofu. 1 oz (28 g) of cheese. 1 cup (150 g) of dried figs. 1 cup (91 g) of cooked broccoli. One 3 oz (85 g) can of sardines or mackerel. Most people need 1,000-1,500 mg of calcium a day. Talk to your dietitian about how much calcium is recommended for you. Shopping Buy plenty of fresh fruits and vegetables. Most people do not need to avoid fruits and vegetables, even if these foods contain nutrients that may contribute to kidney stones. When shopping for convenience foods, choose: Whole pieces of fruit. Pre-made salads with dressing on the side. Low-fat fruit and yogurt smoothies. Avoid buying frozen meals or prepared deli foods. These can be high in sodium. Look for foods with live cultures, such as yogurt and kefir. Choose high-fiber grains, such as whole-wheat breads, oat bran, and wheat cereals. Cooking Do not add  salt to food when cooking. Place a salt shaker on the table and allow each person to add his or her own salt to taste. Use vegetable protein, such as beans, textured vegetable protein (TVP), or tofu, instead of meat in pasta, casseroles, and soups. Meal planning Eat less salt, if told by your dietitian. To do this: Avoid eating processed or pre-made food. Avoid eating fast food. Eat less animal protein, including cheese, meat, poultry, or fish, if told by your dietitian. To do this: Limit the number of times you have meat, poultry, fish, or cheese each week. Eat a diet free of meat at least 2 days a week. Eat only one serving each day of meat, poultry, fish, or seafood. When you prepare animal protein, cut pieces into small portion sizes. For most meat and fish, one serving is about the size of the palm of your hand. Eat at least five servings of fresh fruits and vegetables each day. To do this: Keep fruits and vegetables on hand for snacks. Eat one piece of fruit or a handful of berries with breakfast. Have a salad and fruit at lunch. Have two kinds of vegetables at dinner. Limit foods that are high in a substance called oxalate. These include: Spinach (cooked), rhubarb, beets, sweet potatoes, and Swiss chard. Peanuts. Potato chips, french fries, and baked potatoes with skin on. Nuts and nut products. Chocolate. If you regularly take a diuretic medicine, make sure to eat at least 1 or 2 servings of fruits or vegetables that are high in potassium each day. These include: Avocado. Banana. Orange, prune,   carrot, or tomato juice. Baked potato. Cabbage. Beans and split peas. Lifestyle  Drink enough fluid to keep your urine pale yellow. This is the most important thing you can do. Spread your fluid intake throughout the day. If you drink alcohol: Limit how much you use to: 0-1 drink a day for women who are not pregnant. 0-2 drinks a day for men. Be aware of how much alcohol is in your  drink. In the [place] one drink equals one 12 oz bottle of beer (355 mL), one 5 oz glass of wine (148 mL), or one 1 oz glass of hard liquor (44 mL). Lose weight if told by your health care provider. Work with your dietitian to find an eating plan and weight loss strategies that work best for you. General information Talk to your health care provider and dietitian about taking daily supplements. You may be told the following depending on your health and the cause of your kidney stones: Not to take supplements with vitamin C. To take a calcium supplement. To take a daily probiotic supplement. To take other supplements such as magnesium, fish oil, or vitamin B6. Take over-the-counter and prescription medicines only as told by your health care provider. These include supplements. What foods should I limit? Limit your intake of the following foods, or eat them as told by your dietitian. Vegetables Spinach. Rhubarb. Beets. Canned vegetables. Pickles. Olives. Baked potatoes with skin. Grains Wheat bran. Baked goods. Salted crackers. Cereals high in sugar. Meats and other proteins Nuts. Nut butters. Large portions of meat, poultry, or fish. Salted, precooked, or cured meats, such as sausages, meat loaves, and hot dogs. Dairy Cheese. Beverages Regular soft drinks. Regular vegetable juice. Seasonings and condiments Seasoning blends with salt. Salad dressings. Soy sauce. Ketchup. Barbecue sauce. Other foods Canned soups. Canned pasta sauce. Casseroles. Pizza. Lasagna. Frozen meals. Potato chips. French fries. The items listed above may not be a complete list of foods and beverages you should limit. Contact a dietitian for more information. What foods should I avoid? Talk to your dietitian about specific foods you should avoid based on the type of kidney stones you have and your overall health. Fruits Grapefruit. The item listed above may not be a complete list of foods and beverages you should  avoid. Contact a dietitian for more information. Summary Kidney stones are deposits of minerals and salts that form inside your kidneys. You can lower your risk of kidney stones by making changes to your diet. The most important thing you can do is drink enough fluid. Drink enough fluid to keep your urine pale yellow. Talk to your dietitian about how much calcium you should have each day, and eat less salt and animal protein as told by your dietitian. This information is not intended to replace advice given to you by your health care provider. Make sure you discuss any questions you have with your health care provider. Document Revised: 09/10/2020 Document Reviewed: 09/10/2020 Elsevier Patient Education  2023 Elsevier Inc.  

## 2022-01-27 ENCOUNTER — Other Ambulatory Visit: Payer: Self-pay | Admitting: Family Medicine

## 2022-01-27 DIAGNOSIS — Z1231 Encounter for screening mammogram for malignant neoplasm of breast: Secondary | ICD-10-CM

## 2022-02-03 ENCOUNTER — Ambulatory Visit
Admission: RE | Admit: 2022-02-03 | Discharge: 2022-02-03 | Disposition: A | Discharge status: Home / Self Care | Payer: Managed Care, Other (non HMO) | Source: Ambulatory Visit | Attending: Family Medicine | Admitting: Family Medicine

## 2022-02-03 DIAGNOSIS — Z1231 Encounter for screening mammogram for malignant neoplasm of breast: Secondary | ICD-10-CM | POA: Diagnosis present

## 2022-07-09 ENCOUNTER — Ambulatory Visit: Payer: Managed Care, Other (non HMO) | Admitting: Urology

## 2022-07-09 ENCOUNTER — Other Ambulatory Visit: Payer: Self-pay | Admitting: *Deleted

## 2022-07-09 ENCOUNTER — Encounter: Payer: Self-pay | Admitting: Urology

## 2022-07-09 ENCOUNTER — Ambulatory Visit
Admission: RE | Admit: 2022-07-09 | Discharge: 2022-07-09 | Disposition: A | Discharge status: Home / Self Care | Payer: Managed Care, Other (non HMO) | Attending: Urology | Admitting: Urology

## 2022-07-09 ENCOUNTER — Ambulatory Visit
Admission: RE | Admit: 2022-07-09 | Discharge: 2022-07-09 | Disposition: A | Discharge status: Home / Self Care | Payer: Managed Care, Other (non HMO) | Source: Ambulatory Visit | Attending: Urology | Admitting: Urology

## 2022-07-09 VITALS — BP 162/103 | HR 96 | Ht 66.0 in | Wt 177.0 lb

## 2022-07-09 DIAGNOSIS — N200 Calculus of kidney: Secondary | ICD-10-CM | POA: Diagnosis present

## 2022-07-09 NOTE — Progress Notes (Signed)
   07/09/2022 8:48 AM   Laura Adams 01-Oct-1965 [id number]  Reason for visit: Nephrolithiasis  HPI: 57 year old female who underwent a left PCNL on 05/07/2020 for a complete staghorn stone.  Stone analysis showed 80% calcium phosphate, 20% calcium oxalate.  She denies any problems over the last year and has been doing well.  Prior 24-hour urine results notable for Urine volume of 4.13 L, elevated urine calcium of 237, elevated urine sodium of 180, low urine citrate of 277, pH 5.8.  We discussed the relationship between sodium and calcium, and I recommended a low-salt diet, continuing large volume fluid intake, and increasing citrate containing foods and drinks.  I personally viewed and interpreted her KUB today that shows possible punctate stones x 2 in the left lower pole.  Continue stone prevention strategies as discussed,  RTC 1 year KUB, she would like to continue follow up every 1-2 years for surveillance   Sondra Come, MD  [facility] [address], [address] [place], [place] [address] [phone]

## 2022-11-12 ENCOUNTER — Other Ambulatory Visit: Payer: Self-pay | Admitting: Family Medicine

## 2022-11-12 DIAGNOSIS — Z1231 Encounter for screening mammogram for malignant neoplasm of breast: Secondary | ICD-10-CM

## 2022-12-20 IMAGING — US US INTRAOPERATIVE - NO REPORT
1 series · 1 of 1 positions shown · non-contrast
Comparison: CT of the abdomen and pelvis - 04/20/2020;

INDICATION: Renal stones, access for left-sided percutaneous nephrolithotomy.

[Series 1: us intraoperative · 1 of 1 slices shown]
[im 1/1]
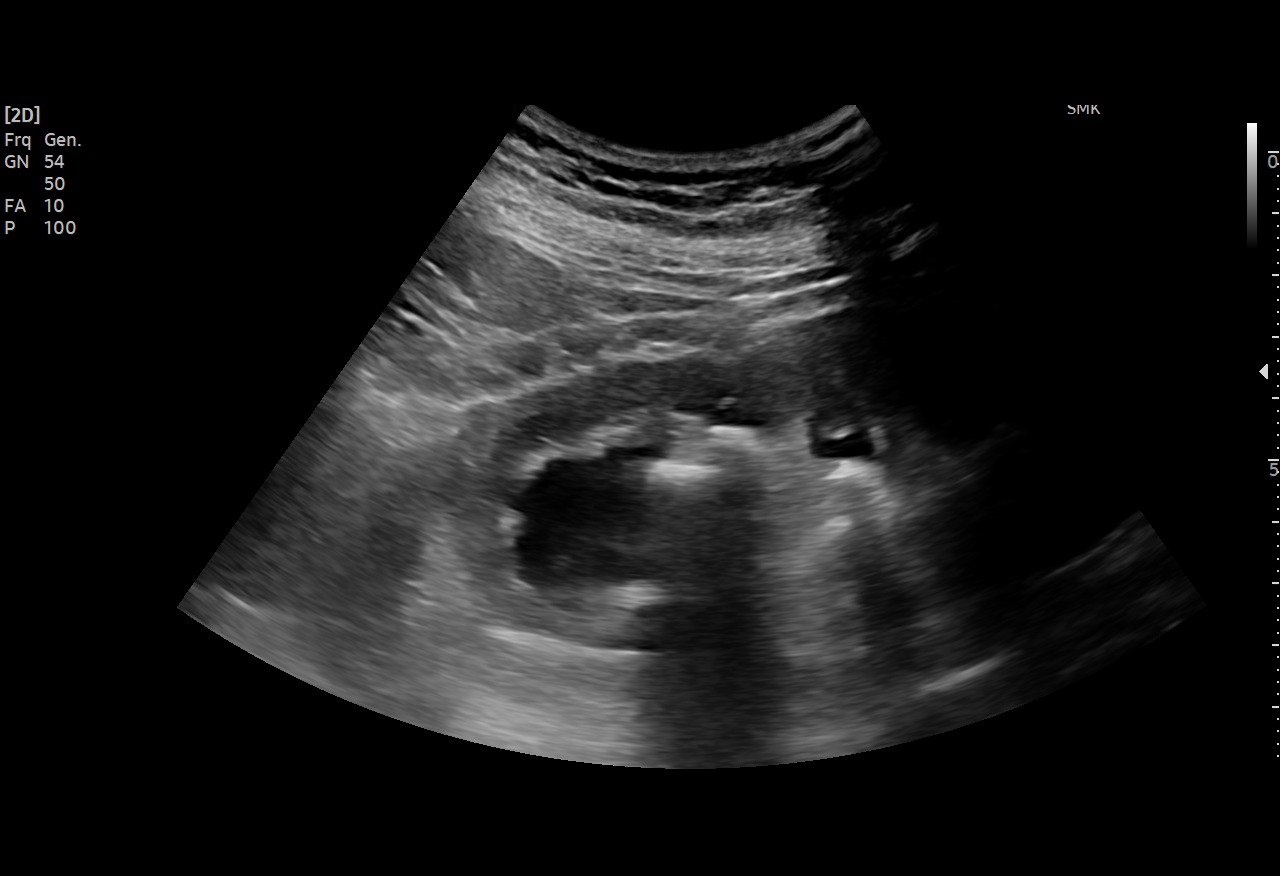

[1 of 1 positions shown; findings below may reference images not displayed]

Per request from ordering urologist, Dr. Chitra, the upper pole
calyx is the desired accessed to allow for complete stone
extraction.

EXAM:
1. FLUOROSCOPIC GUIDANCE FOR PUNCTURE OF THE LEFT SIDED RENAL
COLLECTING SYSTEM.
2. ULTRASOUND AND FLUOROSCOPIC GUIDED PLACEMENT OF A LEFT SIDED
NEPHROURETERAL CATHETER.
intraoperative fluoroscopic images during left-sided retrograde
ureteral stent placement - 04/20/2020

MEDICATIONS:
Patient was administered preprocedural antibiotics during
preparation for the operating room while in the surgical
preoperative holding area; The antibiotic was administered in an
appropriate time frame prior to skin puncture.

ANESTHESIA/SEDATION:
Moderate (conscious) sedation was employed during this procedure. A
total of Versed 2 mg and Fentanyl 100 mcg was administered
intravenously.

Moderate Sedation Time: 27 minutes. The patient's level of
consciousness and vital signs were monitored continuously by
radiology nursing throughout the procedure under my direct
supervision.

CONTRAST:  25 cc Omnipaque 300 - administered into the renal
collecting system.

FLUOROSCOPY TIME:  13 minutes 48 seconds (21.2 mGy)

COMPLICATIONS:
None immediate.

PROCEDURE:
Informed written consent was obtained from the patient after a
discussion of the risks, benefits, and alternatives to treatment.
The flank flank region was prepped with Betadine in a sterile
fashion, and a sterile drape was applied covering the operative
field. A sterile gown and sterile gloves were used for the
procedure. A timeout was performed prior to the initiation of the
procedure.

A pre procedural spot fluoroscopic image was obtained of the upper
abdomen. Ultrasound scanning performed of the right kidney
demonstrates marked dilatation of the superior pole of the right
kidney as was demonstrated on preceding abdominal CT performed
04/20/2020.

Under direct ultrasound guidance, the dilated superior pole calyx
was accessed with a 22 gauge Chiba needle. Access to the collecting
system was confirmed with the reflux of a small amount contrast as
well as limited injection.

Next, a Nitrex wire was coiled within the dilated calyx under
fluoroscopic guidance, the access needle was exchanged for an
Accustick set. Next, with the use of a 4 French glide catheter but
with some difficulty, a regular glidewire was advanced beyond the
obstructing staghorn calculus to the level of the superior aspect of
the left ureter. Contrast injection confirmed appropriate
positioning.

Next, again with some difficulty, the glide catheter was advanced
alongside the pre-existing left-sided ureteral stent to the level of
the urinary bladder. Contrast injection confirmed appropriate
positioning

Next, over a Amplatz wire, the 4 French angled glide catheter as
well as the outer catheter from the Accustick stat were exchanged
for a 5 French Kumpe catheter which was advanced to the level of the
urinary bladder. Postprocedural images were obtained and the
procedure was terminated.

The catheter was sutured to the skin and capped and a dressing was
applied.

The patient tolerated the procedure well without immediate post
procedural complication.
FINDINGS: Pre procedural spot radiographic images demonstrates similar
appearance of known staghorn calculus encompassing the entirety of
left renal pelvis as well as nearly all calices.

The superior end of the pre-existing ureteral stent overlies the
inferior aspect of the expected location of the renal pelvis.

Sonographic evaluation demonstrates persistent dilatation involving
the superior pole of the left kidney, similar to abdominal CT
performed 04/20/2020.

Under direct ultrasound guidance, the dilated posterosuperior calyx
of the left kidney was accessed ultimately allowing placement of a 5
French Kumpe catheter through the access dilated calyx, past the
staghorn calculus to the level of the urinary bladder.

During PCNL access placement, the pre-existing ureteral stent was
displaced to the level of the urinary bladder.
IMPRESSION: 1. Successful fluoroscopic guided placement of a left sided 5 French
Kumpe catheter to the level of the urinary bladder via requested
dilated superior pole calyx to be utilized during impending
nephrolithotomy procedure.
2. Inadvertent displacement pre-existing left-sided ureteral stent
to the level of the urinary bladder during PCNL access acquisition.

Above discussed with referring urologist, Dr. Chitra, at the time
of procedure completion.

## 2022-12-20 IMAGING — DX DG CHEST 1V PORT
1 series · 1 of 1 positions shown · non-contrast
Comparison: None.

CLINICAL DATA: Postop kidney stone removal

EXAM:
PORTABLE CHEST 1 VIEW

[chest ap]
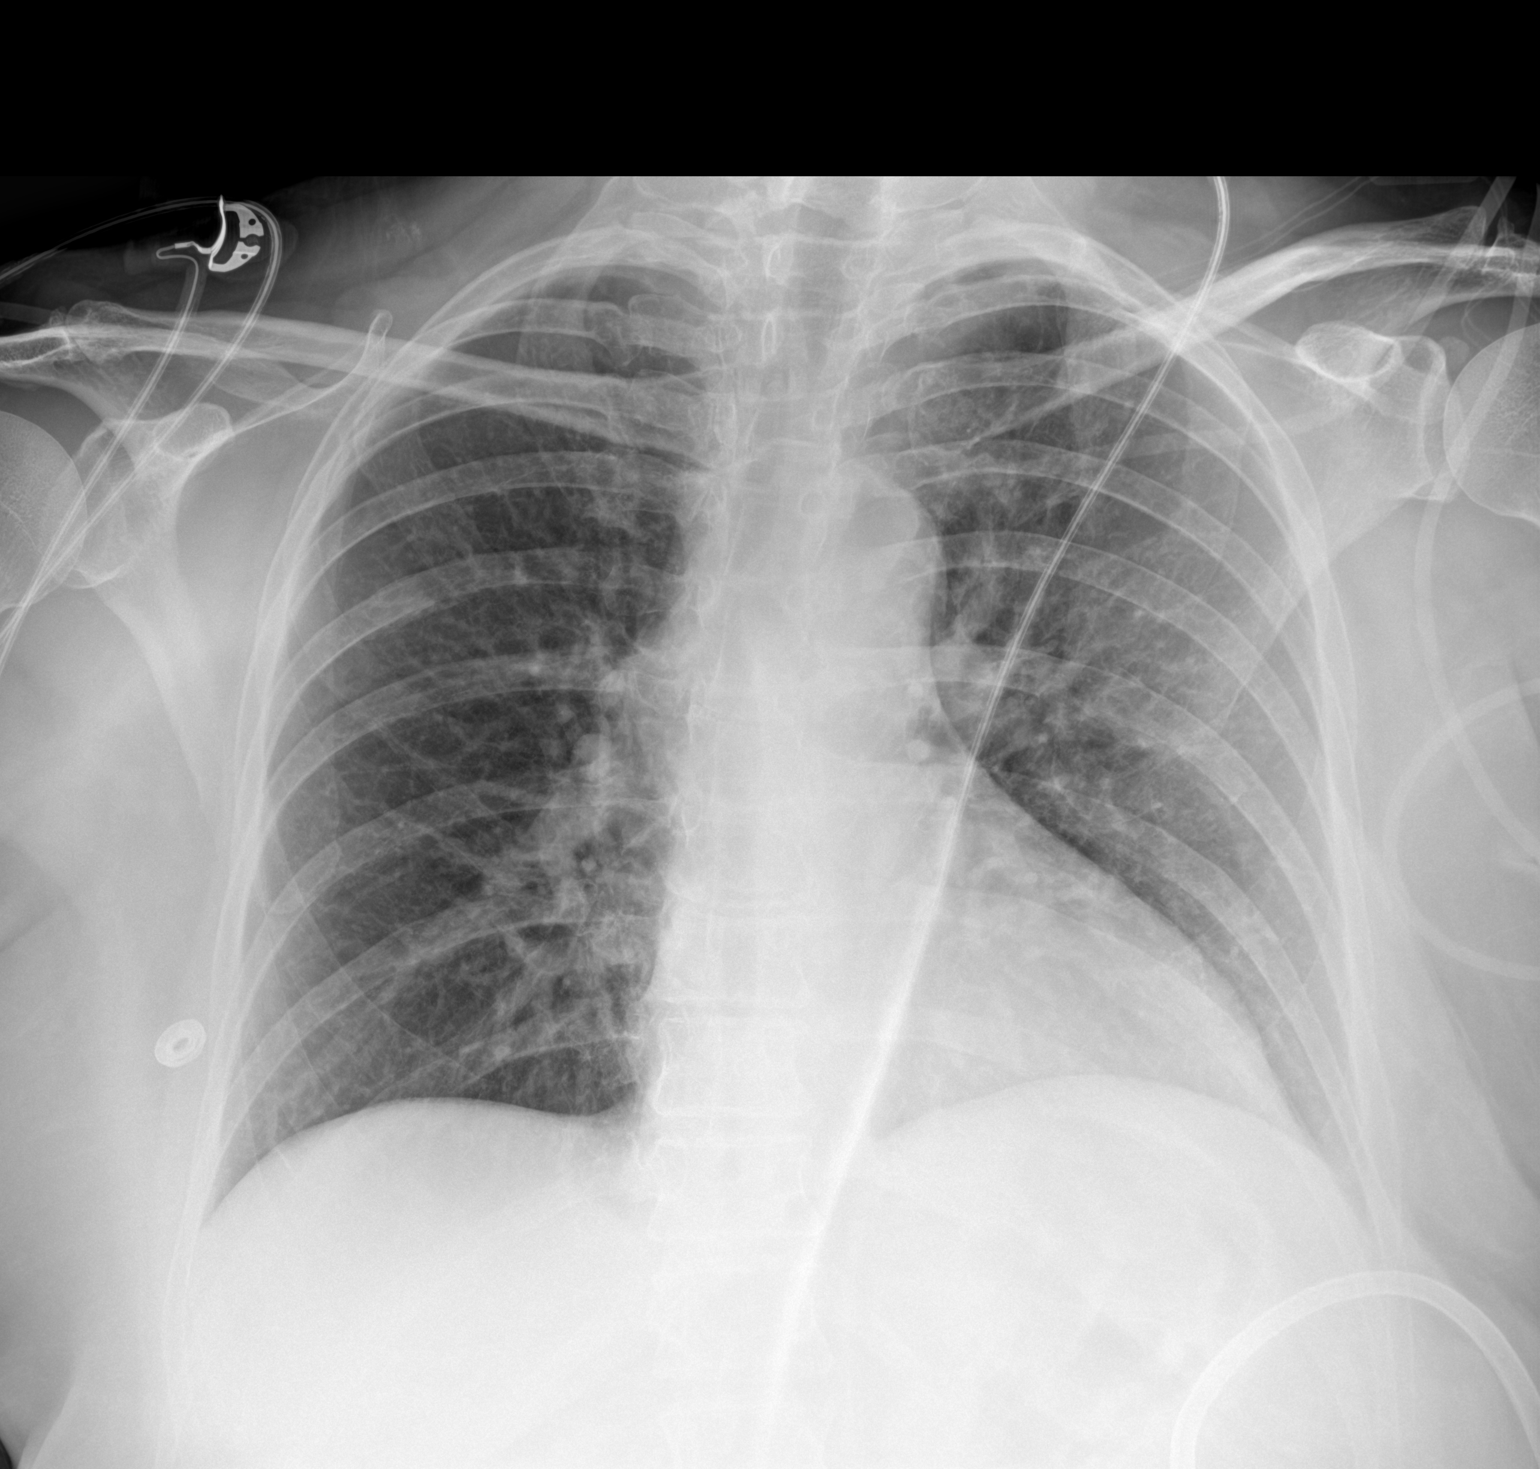

[1 of 1 positions shown; findings below may reference images not displayed]

FINDINGS: Normal mediastinum and cardiac silhouette. Normal pulmonary
vasculature. No evidence of effusion, infiltrate, or pneumothorax.
No acute bony abnormality.
IMPRESSION: No acute cardiopulmonary process.

## 2023-02-05 ENCOUNTER — Ambulatory Visit
Admission: RE | Admit: 2023-02-05 | Discharge: 2023-02-05 | Disposition: A | Discharge status: Home / Self Care | Payer: Managed Care, Other (non HMO) | Source: Ambulatory Visit | Attending: Family Medicine | Admitting: Family Medicine

## 2023-02-05 ENCOUNTER — Ambulatory Visit: Payer: Managed Care, Other (non HMO)

## 2023-02-05 DIAGNOSIS — Z1231 Encounter for screening mammogram for malignant neoplasm of breast: Secondary | ICD-10-CM | POA: Diagnosis present

## 2023-06-22 ENCOUNTER — Telehealth: Payer: Self-pay | Admitting: Urology

## 2023-06-22 NOTE — Telephone Encounter (Signed)
 Patient's 07/08/23 appointment was cancelled and received a message for her to call office back to reschedule. Patient would like to know if this appointment is necessary, as she states that she feels great and has no issues or concerns at the moment. Patient states she can be reached on her work phone at [phone]. Please advise patient.

## 2023-06-22 NOTE — Telephone Encounter (Signed)
 Called pt informed her that we recommend annual f/u for stone management, however if she desires to r/s we can at this time.

## 2023-07-08 ENCOUNTER — Ambulatory Visit: Payer: Self-pay | Admitting: Urology

## 2024-01-11 ENCOUNTER — Other Ambulatory Visit: Payer: Self-pay | Admitting: Family Medicine

## 2024-01-11 DIAGNOSIS — Z1231 Encounter for screening mammogram for malignant neoplasm of breast: Secondary | ICD-10-CM

## 2024-02-08 ENCOUNTER — Ambulatory Visit

## 2024-03-03 ENCOUNTER — Ambulatory Visit: Payer: Self-pay

## 2024-06-28 ENCOUNTER — Ambulatory Visit: Admitting: Urology
